# Patient Record
Sex: Male | Born: 1940 | Race: White | Hispanic: No | Marital: Married | Smoking: Former smoker
Health system: Southern US, Community
[De-identification: ages and names within clinical notes are randomized; demographics above are authoritative.]

## PROBLEM LIST (undated history)

## (undated) DIAGNOSIS — E7800 Pure hypercholesterolemia, unspecified: Secondary | ICD-10-CM

## (undated) DIAGNOSIS — K219 Gastro-esophageal reflux disease without esophagitis: Secondary | ICD-10-CM

## (undated) DIAGNOSIS — D649 Anemia, unspecified: Secondary | ICD-10-CM

## (undated) DIAGNOSIS — M1990 Unspecified osteoarthritis, unspecified site: Secondary | ICD-10-CM

## (undated) DIAGNOSIS — N400 Enlarged prostate without lower urinary tract symptoms: Secondary | ICD-10-CM

## (undated) DIAGNOSIS — IMO0001 Reserved for inherently not codable concepts without codable children: Secondary | ICD-10-CM

## (undated) DIAGNOSIS — T7840XA Allergy, unspecified, initial encounter: Secondary | ICD-10-CM

## (undated) HISTORY — PX: COLONOSCOPY: SHX174

## (undated) HISTORY — DX: Benign prostatic hyperplasia without lower urinary tract symptoms: N40.0

## (undated) HISTORY — DX: Gastro-esophageal reflux disease without esophagitis: K21.9

## (undated) HISTORY — DX: Reserved for inherently not codable concepts without codable children: IMO0001

## (undated) HISTORY — PX: HEMORRHOID SURGERY: SHX153

## (undated) HISTORY — DX: Unspecified osteoarthritis, unspecified site: M19.90

## (undated) HISTORY — DX: Pure hypercholesterolemia, unspecified: E78.00

## (undated) HISTORY — DX: Allergy, unspecified, initial encounter: T78.40XA

## (undated) HISTORY — DX: Anemia, unspecified: D64.9

## (undated) HISTORY — PX: ROTATOR CUFF REPAIR: SHX139

## (undated) HISTORY — PX: SHOULDER SURGERY: SHX246

---

## 2012-10-27 ENCOUNTER — Other Ambulatory Visit: Payer: Self-pay | Admitting: Orthopaedic Surgery

## 2012-10-27 DIAGNOSIS — M545 Low back pain, unspecified: Secondary | ICD-10-CM

## 2012-11-05 ENCOUNTER — Ambulatory Visit
Admission: RE | Admit: 2012-11-05 | Discharge: 2012-11-05 | Disposition: A | Discharge status: Home / Self Care | Payer: Medicare Other | Source: Ambulatory Visit | Attending: Orthopaedic Surgery | Admitting: Orthopaedic Surgery

## 2012-11-05 DIAGNOSIS — M545 Low back pain, unspecified: Secondary | ICD-10-CM

## 2013-02-09 DIAGNOSIS — L03039 Cellulitis of unspecified toe: Secondary | ICD-10-CM | POA: Diagnosis not present

## 2013-02-21 DIAGNOSIS — M47817 Spondylosis without myelopathy or radiculopathy, lumbosacral region: Secondary | ICD-10-CM | POA: Diagnosis not present

## 2013-02-24 DIAGNOSIS — L821 Other seborrheic keratosis: Secondary | ICD-10-CM | POA: Diagnosis not present

## 2013-02-24 DIAGNOSIS — L219 Seborrheic dermatitis, unspecified: Secondary | ICD-10-CM | POA: Diagnosis not present

## 2013-03-09 DIAGNOSIS — K429 Umbilical hernia without obstruction or gangrene: Secondary | ICD-10-CM | POA: Diagnosis not present

## 2013-03-13 DIAGNOSIS — M545 Low back pain, unspecified: Secondary | ICD-10-CM | POA: Diagnosis not present

## 2013-03-13 DIAGNOSIS — M47817 Spondylosis without myelopathy or radiculopathy, lumbosacral region: Secondary | ICD-10-CM | POA: Diagnosis not present

## 2013-03-17 DIAGNOSIS — Z79899 Other long term (current) drug therapy: Secondary | ICD-10-CM | POA: Diagnosis not present

## 2013-03-17 DIAGNOSIS — N529 Male erectile dysfunction, unspecified: Secondary | ICD-10-CM | POA: Diagnosis not present

## 2013-03-17 DIAGNOSIS — D126 Benign neoplasm of colon, unspecified: Secondary | ICD-10-CM | POA: Diagnosis not present

## 2013-03-17 DIAGNOSIS — K449 Diaphragmatic hernia without obstruction or gangrene: Secondary | ICD-10-CM | POA: Diagnosis not present

## 2013-03-17 DIAGNOSIS — K5730 Diverticulosis of large intestine without perforation or abscess without bleeding: Secondary | ICD-10-CM | POA: Diagnosis not present

## 2013-03-17 DIAGNOSIS — Z87891 Personal history of nicotine dependence: Secondary | ICD-10-CM | POA: Diagnosis not present

## 2013-03-17 DIAGNOSIS — D509 Iron deficiency anemia, unspecified: Secondary | ICD-10-CM | POA: Diagnosis not present

## 2013-03-17 DIAGNOSIS — Z86010 Personal history of colonic polyps: Secondary | ICD-10-CM | POA: Diagnosis not present

## 2013-03-17 DIAGNOSIS — N400 Enlarged prostate without lower urinary tract symptoms: Secondary | ICD-10-CM | POA: Diagnosis not present

## 2013-03-17 DIAGNOSIS — Z7982 Long term (current) use of aspirin: Secondary | ICD-10-CM | POA: Diagnosis not present

## 2013-03-17 DIAGNOSIS — K219 Gastro-esophageal reflux disease without esophagitis: Secondary | ICD-10-CM | POA: Diagnosis not present

## 2013-03-17 DIAGNOSIS — R12 Heartburn: Secondary | ICD-10-CM | POA: Diagnosis not present

## 2013-03-17 DIAGNOSIS — E785 Hyperlipidemia, unspecified: Secondary | ICD-10-CM | POA: Diagnosis not present

## 2013-04-19 DIAGNOSIS — M545 Low back pain, unspecified: Secondary | ICD-10-CM | POA: Diagnosis not present

## 2013-04-19 DIAGNOSIS — M47817 Spondylosis without myelopathy or radiculopathy, lumbosacral region: Secondary | ICD-10-CM | POA: Diagnosis not present

## 2013-05-01 DIAGNOSIS — N320 Bladder-neck obstruction: Secondary | ICD-10-CM | POA: Diagnosis not present

## 2013-05-01 DIAGNOSIS — R972 Elevated prostate specific antigen [PSA]: Secondary | ICD-10-CM | POA: Diagnosis not present

## 2013-05-01 DIAGNOSIS — N529 Male erectile dysfunction, unspecified: Secondary | ICD-10-CM | POA: Diagnosis not present

## 2013-05-01 DIAGNOSIS — N400 Enlarged prostate without lower urinary tract symptoms: Secondary | ICD-10-CM | POA: Diagnosis not present

## 2013-05-29 DIAGNOSIS — R972 Elevated prostate specific antigen [PSA]: Secondary | ICD-10-CM | POA: Diagnosis not present

## 2013-06-16 DIAGNOSIS — M47817 Spondylosis without myelopathy or radiculopathy, lumbosacral region: Secondary | ICD-10-CM | POA: Diagnosis not present

## 2013-06-16 DIAGNOSIS — IMO0002 Reserved for concepts with insufficient information to code with codable children: Secondary | ICD-10-CM | POA: Diagnosis not present

## 2013-06-16 DIAGNOSIS — M545 Low back pain, unspecified: Secondary | ICD-10-CM | POA: Diagnosis not present

## 2013-06-19 DIAGNOSIS — M545 Low back pain, unspecified: Secondary | ICD-10-CM | POA: Diagnosis not present

## 2013-06-19 DIAGNOSIS — M48061 Spinal stenosis, lumbar region without neurogenic claudication: Secondary | ICD-10-CM | POA: Diagnosis not present

## 2013-06-19 DIAGNOSIS — G243 Spasmodic torticollis: Secondary | ICD-10-CM | POA: Diagnosis not present

## 2013-06-19 DIAGNOSIS — D509 Iron deficiency anemia, unspecified: Secondary | ICD-10-CM | POA: Diagnosis not present

## 2013-06-19 DIAGNOSIS — Z23 Encounter for immunization: Secondary | ICD-10-CM | POA: Diagnosis not present

## 2013-06-27 DIAGNOSIS — M47817 Spondylosis without myelopathy or radiculopathy, lumbosacral region: Secondary | ICD-10-CM | POA: Diagnosis not present

## 2013-06-27 DIAGNOSIS — M545 Low back pain, unspecified: Secondary | ICD-10-CM | POA: Diagnosis not present

## 2013-09-14 ENCOUNTER — Ambulatory Visit: Payer: Medicare Other | Admitting: Podiatry

## 2013-09-14 ENCOUNTER — Other Ambulatory Visit: Payer: Self-pay | Admitting: *Deleted

## 2013-09-14 ENCOUNTER — Encounter: Payer: Self-pay | Admitting: Podiatry

## 2013-09-14 VITALS — BP 143/85 | HR 70 | Resp 16 | Ht 68.0 in | Wt 165.0 lb

## 2013-09-14 DIAGNOSIS — L600 Ingrowing nail: Secondary | ICD-10-CM | POA: Diagnosis not present

## 2013-09-14 MED ORDER — NEOMYCIN-POLYMYXIN-HC 3.5-10000-1 OT SOLN: OTIC | Status: DC

## 2013-09-14 NOTE — Progress Notes (Signed)
   Subjective:    Patient ID: Hutchinson Isenberg, male    DOB: Oct 03, 1940, 73 y.o.   MRN: [id number]  HPI Comments: Problem with an ingrown toenail on the right great toe, lateral corner. It has been sore for a while and if i hit it i can feel it  It was worked on in January but it seems to have come back      Review of Systems  Genitourinary: Positive for frequency.  Skin:       Change in nails   All other systems reviewed and are negative.      Objective:   Physical Exam: I have reviewed his past medical history medications allergies surgeries social history and review of systems. Pulses are strongly palpable bilateral. Neurologic sensorium is intact per Semmes-Weinstein monofilament. Deep tendon reflexes are intact bilateral muscle strength is 5 over 5 dorsiflexors plantar flexors inverters everters all intrinsic musculature is intact. Orthopedic evaluation demonstrates all joints distal to the ankle a full range of motion without crepitation. Cutaneous evaluation demonstrates supple well hydrated cutis with exception of sharp incurvated nail margin along the fibular border of the hallux right. This is painful on palpation there is erythema there is no. Once her drainage.          Assessment & Plan:  Assessment: Pain in limb secondary to ingrown nail paronychia abscess hallux right.  Plan: Discussed etiology pathology cervical versus surgical therapies. At this point we performed a chemical matrixectomy after local anesthetic been administered. He tolerated procedure well. He was given both oral and written home-going instructions for the care of this toe as well as a prescription for Cortisporin Otic which will be sent to his pharmacy for use after soaking twice daily in Betadine and water. We will followup with him in one to 2 weeks to make sure this is healing well. Should there be questions or concerns she will notify us immediately.

## 2013-09-14 NOTE — Patient Instructions (Signed)

## 2013-09-15 ENCOUNTER — Telehealth: Payer: Self-pay | Admitting: *Deleted

## 2013-09-15 NOTE — Telephone Encounter (Signed)
Saw yesterday for ingrown toenail.  I'm soaking like he said.  How many days am I to do this?

## 2013-09-15 NOTE — Telephone Encounter (Signed)
I saw him yesterday.  I need to know how long I need to continue the soaks. I told him as long as he has drainage, he needs to continue the soaks.    He asked how long does the drainage last.  I told him it varies from person to person can't give a definite time.  He stated okay thank you.

## 2013-09-19 ENCOUNTER — Ambulatory Visit (INDEPENDENT_AMBULATORY_CARE_PROVIDER_SITE_OTHER): Payer: Medicare Other | Admitting: Podiatry

## 2013-09-19 ENCOUNTER — Encounter: Payer: Self-pay | Admitting: Podiatry

## 2013-09-19 DIAGNOSIS — L600 Ingrowing nail: Secondary | ICD-10-CM

## 2013-09-19 NOTE — Progress Notes (Signed)
He presents today for followup of his matrixectomy tibial border hallux right. He continues to soak twice daily in Betadine and water and apply Cortisporin otic as directed he denies fever chills nausea vomiting muscle aches and pains.  Objective: Vital signs are stable he is alert and oriented x3. There is no erythema edema cellulitis drainage or odor.  Assessment: Well-healing matrixectomy tibial border hallux right.  Plan: Discontinue Betadine start with Epsom salts warm water soaks continue to soak twice daily until completely healed. Continue Cortisporin Otic as directed. Covered in the day and leave open at night.

## 2013-09-21 DIAGNOSIS — D649 Anemia, unspecified: Secondary | ICD-10-CM | POA: Diagnosis not present

## 2013-09-25 DIAGNOSIS — M545 Low back pain, unspecified: Secondary | ICD-10-CM | POA: Diagnosis not present

## 2013-09-25 DIAGNOSIS — M47817 Spondylosis without myelopathy or radiculopathy, lumbosacral region: Secondary | ICD-10-CM | POA: Diagnosis not present

## 2013-09-26 ENCOUNTER — Ambulatory Visit: Payer: Medicare Other | Admitting: Podiatry

## 2013-09-27 ENCOUNTER — Telehealth: Payer: Self-pay | Admitting: *Deleted

## 2013-09-27 NOTE — Telephone Encounter (Signed)
I need to ask or get some information about how much longer to continue this treatment, soaking and so forth.  Someone give me a call, I'd appreciate it.  I called and advised him to continue to do the soaks and dressings as long as there is drainage on the bandaid.  He stated there is just a little spot, it may be just from the drops but I'm not sure.  What should I do?  I recommended to him to continue the soaks.  He stated well I've been soaking for about a week, is this typical?  I told him yes, that is normal.  He said well thank you for calling, I may call you again.  I told him okay.

## 2013-10-03 ENCOUNTER — Ambulatory Visit (INDEPENDENT_AMBULATORY_CARE_PROVIDER_SITE_OTHER): Payer: Medicare Other | Admitting: Podiatry

## 2013-10-03 ENCOUNTER — Encounter: Payer: Self-pay | Admitting: Podiatry

## 2013-10-03 VITALS — BP 122/76 | HR 72 | Resp 16

## 2013-10-03 DIAGNOSIS — L600 Ingrowing nail: Secondary | ICD-10-CM | POA: Diagnosis not present

## 2013-10-03 NOTE — Progress Notes (Signed)
   Subjective:    Patient ID: Juan Kennedy, male    DOB: 1940/02/06, 74 y.o.   MRN: [id number]  HPI Comments:  "Still some tenderness in there"  Follow up Matrixectomy 1st toe right - medial border   States that he has been continuing with Epson salts daily followed by antibiotic ointment and a Band-Aid. He was using the Corticosporin drops but states it is difficult to keep medication at the procedure site. Denies any systemic complaints such as fevers, chills, nausea, vomiting. No other complaints at this time.    Review of Systems  All other systems reviewed and are negative.      Objective:   Physical Exam AAO x3, NAD DP/PT pulses palpable b/l. CRT < 3sec Protective sensation intact with the Semmes Weinstein monofilament Right hallux medial border status post partial nail avulsion. There is no surrounding erythema, edema, drainage, purulence. The site has not completely healed at this time although close.        Assessment & Plan:  The 73 year old male status post right tibial border matrixectomy -At this time the wound appears to be healing nicely without any clinical signs of infection. Continue with Epsom salts soaks until the wound is completely healed. During the day dressing interbody ointment and a Band-Aid and can leave uncovered at night. -Monitor for any clinical signs or symptoms of infection and directed to go to directed to the emergency room if any are to occur call the office immediately. -Followup in 2 weeks or sooner if any palms are to arise. Call with any questions or concerns or change in symptoms.

## 2013-10-03 NOTE — Patient Instructions (Signed)
Monitor for any signs/symptoms of infection. Call the office immediately if any occur or go directly to the emergency room. Call with any questions/concerns.  

## 2013-10-13 ENCOUNTER — Ambulatory Visit: Payer: Medicare Other | Admitting: Podiatry

## 2013-10-23 ENCOUNTER — Telehealth: Payer: Self-pay | Admitting: *Deleted

## 2013-10-23 DIAGNOSIS — N529 Male erectile dysfunction, unspecified: Secondary | ICD-10-CM | POA: Diagnosis not present

## 2013-10-23 DIAGNOSIS — R972 Elevated prostate specific antigen [PSA]: Secondary | ICD-10-CM | POA: Diagnosis not present

## 2013-10-23 DIAGNOSIS — N138 Other obstructive and reflux uropathy: Secondary | ICD-10-CM | POA: Diagnosis not present

## 2013-10-23 NOTE — Telephone Encounter (Signed)
I've been seen for an ingrown toenail a couple times, rechecks and everything.  It's still continuing to bother me.  I'd like somebody to take a look at it if they could.  I last saw Dr. Jacqualyn Posey September 1 and then we went on vacation.  I just don't think things are going on like they should.  Give me a call, thank you.  Patient walked in to the office.  He stated that there is a brown spot that has developed underneath the nail and it is sore to put pressure on it.  I asked if it was a scab.  He stated it was not.  I told him we need for him to schedule an appointment with Dr. Jacqualyn Posey to determine if it may be fungus or something else.  He scheduled an appointment.

## 2013-10-25 ENCOUNTER — Encounter: Payer: Self-pay | Admitting: Podiatry

## 2013-10-25 ENCOUNTER — Ambulatory Visit (INDEPENDENT_AMBULATORY_CARE_PROVIDER_SITE_OTHER): Payer: Medicare Other | Admitting: Podiatry

## 2013-10-25 VITALS — BP 143/89 | HR 80 | Resp 16

## 2013-10-25 DIAGNOSIS — L600 Ingrowing nail: Secondary | ICD-10-CM

## 2013-10-25 MED ORDER — CEPHALEXIN 500 MG PO CAPS: 500.0000 mg | ORAL_CAPSULE | Freq: Three times a day (TID) | ORAL | Status: DC

## 2013-10-25 NOTE — Patient Instructions (Signed)
Continue to soak in Epson salts twice a day cover with antibiotic ointment and a Band-Aid. Monitor for any signs/symptoms of infection. Call the office immediately if any occur or go directly to the emergency room. Call with any questions/concerns.

## 2013-10-26 ENCOUNTER — Encounter: Payer: Self-pay | Admitting: Podiatry

## 2013-10-26 NOTE — Progress Notes (Signed)
Patient ID: Juan Kennedy, male   DOB: [birth year], 73 y.o.   MRN: [id number]  Subjective: Juan Kennedy returns the office they for followup evaluation of matricectomy right medial border. States that he was doing well but since has noticed a dark spot appearing under the nail just to the side where the procedure was done. Also states that he is started to have mild amount of discomfort around the nail base with a procedure was performed Denies any drainage from around the site. Has noticed a mild amount of redness at the site of discomfort. Denies any systemic complaints as fevers, chills, nausea, vomiting. No other complaints at this time.  Objective: AAO x3, NAD DP PT pulses palpable bilaterally, CRT< 3 seconds. Neurological status unchanged. Right medial hallux border status post partial nail avulsion. There is slight localized erythema at the proximal medial nail border with mild discomfort. There is no drainage, malodor, purulence. There is no ascending cellulitis. Just under the nail next the site of the nail avulsion in the proximal nail is a slight brown discolored spot.  MMT 5/5, ROM WNL No leg pain/swelling/warmth  Assessment: 73 year old male status post right tibial border matrixectomy with mild recurrence of pain at the site of the nail procedure.  Plan: -Conservative versus surgical treatment were discussed including alternatives, risks, complications. -The proximal medial nail border was debrided with a curette to remove a large amount of debris from the site of the nail avulsion. This area was localized to that small mild area of erythema. There is no purulence or drainage noted. Discussed we could proceed with another partial nail avulsion but hopefully debridement of the area will relief symptoms. Also patient will be leaving for vacation for approximately one week to [place]. Discussed that if symptoms continue can proceed with a repeat procedure if needed. Do to the mild  erythema, prescribed keflex.  -Followup once he returns from vacation or sooner if needed. In the meantime call any questions, concerns, change in symptoms. Monitor for any clinical signs or symptoms of infection and directed to call the office immediately if any are to occur or go directly to the emergency room.

## 2013-11-08 ENCOUNTER — Encounter: Payer: Self-pay | Admitting: Podiatry

## 2013-11-08 ENCOUNTER — Ambulatory Visit (INDEPENDENT_AMBULATORY_CARE_PROVIDER_SITE_OTHER): Payer: Medicare Other

## 2013-11-08 ENCOUNTER — Ambulatory Visit (INDEPENDENT_AMBULATORY_CARE_PROVIDER_SITE_OTHER): Payer: Medicare Other | Admitting: Podiatry

## 2013-11-08 VITALS — BP 165/79 | HR 74 | Resp 14

## 2013-11-08 DIAGNOSIS — M79674 Pain in right toe(s): Secondary | ICD-10-CM | POA: Diagnosis not present

## 2013-11-08 DIAGNOSIS — L600 Ingrowing nail: Secondary | ICD-10-CM

## 2013-11-08 NOTE — Patient Instructions (Addendum)
Soak Instructions    THE DAY AFTER THE PROCEDURE  Place 1/4 cup of epsom salts in a quart of warm tap water.  Submerge your foot or feet with outer bandage intact for the initial soak; this will allow the bandage to become moist and wet for easy lift off.  Once you remove your bandage, continue to soak in the solution for 20 minutes.  This soak should be done twice a day.  Next, remove your foot or feet from solution, blot dry the affected area and cover.  You may use a band aid large enough to cover the area or use gauze and tape.  Apply other medications to the area as directed by the doctor such as polysporin neosporin.  IF YOUR SKIN BECOMES IRRITATED WHILE USING THESE INSTRUCTIONS, IT IS OKAY TO SWITCH TO  WHITE VINEGAR AND WATER. Or you may use antibacterial soap and water to keep the toe clean  Monitor for any signs/symptoms of infection. Call the office immediately if any occur or go directly to the emergency room. Call with any questions/concerns.  Continue keflex.

## 2013-11-08 NOTE — Progress Notes (Signed)
Patient ID: Juan Kennedy, male   DOB: 09-Nov-1940, 73 y.o.   MRN: [id number]  Subjective: Juan Kennedy, 73 year old male, returns the office they for followup evaluation for right medial hallux ingrown toenail. He states his last appointment has had increased pain along the the nail border and points to the proximal medial nail border. He has had localized erythema and edema over the area. Denies any drainage. Denies any systemic complaints as fevers, chills, nausea, vomiting. No other complaints at this time. No acute changes since last appointment.  Objective: AAO x3, NAD DP/PT pulses palpable 2/4 b/l. CRT < 3 sec Protective sensation intact with Simms Weinstein monofilament, vibratory sensation intact, Achilles tendon reflex intact. Right medial border of the hallux nail with localized erythema and edema. There is mild tenderness to palpation over the site. No drainage. No ascending cellulitis. No fluctuance, crepitus. No calf pain, swelling, warmth. MMT 5/5, ROM WNL  Assessment: 73 year old male with recurrent right medial hallux ingrown toenail.  Plan: -X-rays were obtained and reviewed the patient. There is a small exostosis off the dorsal aspect of the distal phalanx. Spur off the medial aspect of the phalanx. -Conservative versus surgical treatment were discussed including alternatives, risks, complications. -At this time the patient is requesting further partial nail avulsion due to reoccurring ingrown toenail, erythema and edema. Risks and complications were discussed the patient for which she understands and wishes to proceed. Under sterile skin preparation total of 2.5 cc of a one-to-one mixture of 2% lidocaine plain and 0.5% Marcaine plain was infiltrated in a hallux block fashion the right foot. Once anesthetized the right hallux was prepped in sterile fashion. Next a small portion of the medial aspect of the nail was excised and there is noted a small amount of ingrowing toenail  underneath the proximal nail border of the site of the localized erythema and edema. No purulence was identified. Once removed the area was inspected no further nail was identified. Underlying skin intact. The site was then irrigated and betadine ointment was applied followed by a dry sterile dressing. A tourniquet was used for the procedure and after application a dressing was removed and there is noted to be an immediate capillary refill time noted to the digit. Patient tolerated procedure well without complications. -Post procedure instructions discussed the patient in detail for which he verbally understood. -Start Keflex for 1 week. -Followup in 1 week or sooner if any problems are to arise or any change in symptoms. In the meantime call the office with any questions, concerns. Monitor for any signs or symptoms of infection and directed to call the office immediately if any are to occur or go directly to the emergency room.

## 2013-11-09 DIAGNOSIS — H2513 Age-related nuclear cataract, bilateral: Secondary | ICD-10-CM | POA: Diagnosis not present

## 2013-11-15 ENCOUNTER — Encounter: Payer: Self-pay | Admitting: Podiatry

## 2013-11-15 ENCOUNTER — Ambulatory Visit (INDEPENDENT_AMBULATORY_CARE_PROVIDER_SITE_OTHER): Payer: Medicare Other | Admitting: Podiatry

## 2013-11-15 VITALS — BP 158/88 | HR 70 | Resp 14

## 2013-11-15 DIAGNOSIS — Z9889 Other specified postprocedural states: Secondary | ICD-10-CM

## 2013-11-15 NOTE — Progress Notes (Signed)
Patient ID: Juan Kennedy, male   DOB: 1940-06-05, 72 y.o.   MRN: [id number]  Subjective: Juan Kennedy returns the office today for followup of evaluaiton status post right medial hallux partial nail avulsiions for ingrown toenail recurrence. He states that his pain is decreased since last appointment. He does have a small area of the brown spot underneath the nail next to where the procedure was performed for which he is concerned about. Denies any increase redness, ascending cellulitis. Mild amount of bloody drainage. He has been doing twice a day epsom salt soaks and taking keflex. No other complaints at this time. Denies any systemic complaints as fevers, chills, nausea, vomiting.  Objective: AA0 x3, NAD DP/PT pulses palpable bilaterally, CRT less than 3 seconds Protective sensations Weinstein monofilament Status post medial border right hallux partial nail avulsion. Area appears to be healing appropriately at this timeframe. There is a small amount of subungual hematoma on the middle portion of the nail just lateral to the procedure site. No surrounding erythema or ascending cellulitis. No drainage/purulence identified. No tenderness to palpation over the procedure site. No malodor. No fluctuance/crepitance.  No calf pain, swelling, warmth.  Assessment: 73 year old male status post right medial border of the hallux partial nail avulsion due to recurrent ingrown nail.  Plan: -Treatment options discussed including alternatives, risks, complications. -Continue with twice a day Epson salt soaks followed by antibiotic ointment and a Band-Aid. Can leave uncovered at night. -Can finished his course of Keflex for which he finishes today and no further need for antibiotics. -Monitor for any signs or symptoms of infection and directed to call the office immediately if any are to occur or go directly to the emergency room. -Discussed the patient at the brown spots of most likely from subungual  hematoma from the procedure and will likely grow out with the nail. -Followup in 2 weeks or sooner if any problems are to arise or any changes symptoms. Call the office with any questions, concerns.

## 2013-11-15 NOTE — Patient Instructions (Signed)
Continue soaking in epsom salts twice a day and cover with antibiotic ointment and band-aid. Can leave uncovered.  Monitor for any signs/symptoms of infection. Call the office immediately if any occur or go directly to the emergency room. Call with any questions/concerns.

## 2013-11-16 DIAGNOSIS — Z23 Encounter for immunization: Secondary | ICD-10-CM | POA: Diagnosis not present

## 2013-11-29 ENCOUNTER — Ambulatory Visit (INDEPENDENT_AMBULATORY_CARE_PROVIDER_SITE_OTHER): Payer: Medicare Other | Admitting: Podiatry

## 2013-11-29 ENCOUNTER — Ambulatory Visit: Payer: Medicare Other | Admitting: Podiatry

## 2013-11-29 ENCOUNTER — Encounter: Payer: Self-pay | Admitting: Podiatry

## 2013-11-29 VITALS — BP 134/78 | HR 71 | Resp 14

## 2013-11-29 DIAGNOSIS — L600 Ingrowing nail: Secondary | ICD-10-CM

## 2013-11-29 DIAGNOSIS — M79674 Pain in right toe(s): Secondary | ICD-10-CM

## 2013-11-30 ENCOUNTER — Encounter: Payer: Self-pay | Admitting: Podiatry

## 2013-11-30 NOTE — Progress Notes (Signed)
Patient ID: Juan Kennedy, male   DOB: Oct 21, 1940, 73 y.o.   MRN: [id number]  Subjective: Patient returns to the office for follow up evaluation status post right medial hallux partial nail avulsion. He states that it is more "normal feeling that it has in awhile". He continues to soak twice a day followed by applying interbody appointment and a Band-Aid in the day and leave it uncovered at night. He denies any drainage or pain associated with the area at this time. Denies any systemic complaints of fevers, chills, nausea, vomiting. No other complaints at this time. No acute changes and facet appointment.  Objective: AAO x3, NAD DP/PT pulses palpable bilaterally, CRT less than 3 seconds Protective sensation intact with Simms Weinstein monofilament Since his right medial border of the hallux partial nail avulsion healing well this time. There is no drainage, ascending cellulitis, pain on palpation. There is no areas of fluctuance. There is no signs of ingrowing of the nail. Small amount of hyperkeratotic tissue overlying the procedure site. No calf pain, swelling, warmth, erythema  Assessment: Right medial border of the hallux status post partial nail avulsion due to recurrent ingrown toenail, healing well  Plan: -Treatment options discussed including alternatives, risks, complications. -Continue to cover with Band-Aid during the day. Patient can continue soaking in epsom salts daily. -Monitor for any clinical signs or symptoms of infection or reoccurrence and directed to call the office immediately if any are to occur or go directly to the ER.  -Follow up in one months or sooner if any problems are to arise or any change in symptoms. In the meantime call the office with any questions, concerns.

## 2013-12-13 DIAGNOSIS — G243 Spasmodic torticollis: Secondary | ICD-10-CM | POA: Diagnosis not present

## 2013-12-27 DIAGNOSIS — D509 Iron deficiency anemia, unspecified: Secondary | ICD-10-CM | POA: Diagnosis not present

## 2013-12-27 DIAGNOSIS — L219 Seborrheic dermatitis, unspecified: Secondary | ICD-10-CM | POA: Diagnosis not present

## 2013-12-27 DIAGNOSIS — M436 Torticollis: Secondary | ICD-10-CM | POA: Diagnosis not present

## 2013-12-27 DIAGNOSIS — N400 Enlarged prostate without lower urinary tract symptoms: Secondary | ICD-10-CM | POA: Diagnosis not present

## 2013-12-27 DIAGNOSIS — R972 Elevated prostate specific antigen [PSA]: Secondary | ICD-10-CM | POA: Diagnosis not present

## 2013-12-27 DIAGNOSIS — E785 Hyperlipidemia, unspecified: Secondary | ICD-10-CM | POA: Diagnosis not present

## 2013-12-27 DIAGNOSIS — G4700 Insomnia, unspecified: Secondary | ICD-10-CM | POA: Diagnosis not present

## 2013-12-27 DIAGNOSIS — Z008 Encounter for other general examination: Secondary | ICD-10-CM | POA: Diagnosis not present

## 2013-12-27 DIAGNOSIS — Z1389 Encounter for screening for other disorder: Secondary | ICD-10-CM | POA: Diagnosis not present

## 2013-12-27 DIAGNOSIS — M5126 Other intervertebral disc displacement, lumbar region: Secondary | ICD-10-CM | POA: Diagnosis not present

## 2014-01-05 ENCOUNTER — Ambulatory Visit: Payer: Medicare Other | Admitting: Podiatry

## 2014-01-17 ENCOUNTER — Ambulatory Visit (INDEPENDENT_AMBULATORY_CARE_PROVIDER_SITE_OTHER): Payer: Medicare Other | Admitting: Podiatry

## 2014-01-17 VITALS — BP 140/85 | HR 80 | Resp 16

## 2014-01-17 DIAGNOSIS — M79674 Pain in right toe(s): Secondary | ICD-10-CM | POA: Diagnosis not present

## 2014-01-17 DIAGNOSIS — L603 Nail dystrophy: Secondary | ICD-10-CM | POA: Diagnosis not present

## 2014-01-17 NOTE — Patient Instructions (Signed)
Continue to soak in epsom salts  Monitor for any signs/symptoms of infection. Call the office immediately if any occur or go directly to the emergency room. Call with any questions/concerns.

## 2014-01-19 ENCOUNTER — Encounter: Payer: Self-pay | Admitting: Podiatry

## 2014-01-19 NOTE — Progress Notes (Signed)
Patient ID: Juan Kennedy, male   DOB: 14-Aug-1940, 73 y.o.   MRN: [id number]  Subjective: 73 year old male returns the office today for evaluation of right hallux nail swelling. He states over the last couple weeks she has noticed some swelling along the nail border for which she points to the proximal nail border. He is previously undergone 3 partial nail avulsions within the year due to a painful ingrown toenail on the medial nail border. The patient denies any increase in redness or any pain to the area. Since last appointment he has refilled the keflex and completed another course of antibiotics on his own. Denies any systemic complaints such as fevers, chills, nausea vomiting. No acute changes since last appointment, no other complaints at this time.   Objective: AAO x3, NAD DP/PT pulses palpable b/l, CRT < 3 sec Protective sensation intact with SWMF, vibratory sensation intact, Achilles tendon reflex intact.  On the medial border of the right hallux nail there is hyperkeratotic tissue within the nail border. There is mild chronic erythema around the nail however there is no signficiant increase in erythema, ascending cellulitis. There is trace edema to the proximal nail border. There is no overlying warmth or erythema over this area. There is no tenderness to palpation overlying the area or along the nail borders. The remaining nails without pathology. No areas of pinpoint bony tenderness, or pain with vibratory sensation, MMT 5/5, ROM WNL No pain with calf compression, swelling, warmth, erythema  Assessment: 73 year old male with trace edema around the proximal nail border the right hallux with hyperkeratotic tissue buildup in the procedure site.  Plan: -Treatment options both conservative and surgical were discussed with the patient including alternatives, risks, complications. -The medial aspect of the right hallux nail border was sharply debrided without complications to patient comfort  to remove hyperkeratotic tissue and debris within the nail border. The proximal nail borders also curetted to remove debris. There is no drainage or purulence identified. At this time as the patient's already had 3 partial nail avulsions of the medial nail border this year, he would likely need a total nail avulsion. He wishes to hold off on that at this time. Recommended to soak in Epson salts twice a day. If there is any increase in erythema, edema, pain, drainage, or other clinical sings/symptoms of infection to call the office immediately. -Follow-up in 2 weeks, or sooner should any problems arise. At that time, if there continues to be symptoms or if symptoms worsen will consider a total nail avulsion. In the meantime, encouraged to call with any questions/concerns/change in symptoms.

## 2014-01-31 ENCOUNTER — Encounter: Payer: Self-pay | Admitting: Podiatry

## 2014-01-31 ENCOUNTER — Ambulatory Visit (INDEPENDENT_AMBULATORY_CARE_PROVIDER_SITE_OTHER): Payer: Medicare Other | Admitting: Podiatry

## 2014-01-31 VITALS — BP 128/76 | HR 79 | Resp 18

## 2014-01-31 DIAGNOSIS — M79674 Pain in right toe(s): Secondary | ICD-10-CM | POA: Diagnosis not present

## 2014-02-02 ENCOUNTER — Encounter: Payer: Self-pay | Admitting: Podiatry

## 2014-02-02 NOTE — Progress Notes (Signed)
Patient ID: Juan Kennedy, male   DOB: [birth year], 74 y.o.   MRN: [id number]  Subjective: 74 year old male returns the office if up evaluation of right hallux swelling around the proximal nail border. Since last appointment he states he has continue soaking the feet in Epson salts twice a day followed by antibiotic ointment and a Band-Aid over the site of the prior partial nail avulsion. Since last appointment, he states the area has significantly improved and the erythema has decreased as well as the edema. He states that he does not have any pain currently to the toe. He does that if he is on his feet for prolonged time he does have some discomfort over one the nail. No other complaints at this time in no acute changes since last appointment. Denies any systemic complaints such as fevers, chills, nausea, vomiting.  Objective: AAO x3, NAD DP/PT pulses palpable bilaterally, CRT less than 3 seconds Protective sensation intact with Simms Weinstein monofilament, vibratory sensation intact, Achilles tendon reflex intact Status post partial nail avulsion right medial hallux nail border. Currently, there is no edema along the proximal/medial/lateral nail borders of the right hallux. There is trace erythema around the nail however compared to the other nails is not significant. There is no drainage or purulence expressed. No ascending cellulitis. There is no tears palpation around the nail this time. Open lesions or pre-ulcerative lesions No pain with calf compression, swelling, warmth, erythema. No areas of pinpoint bony tenderness or pain with vibratory sensation.  Assessment: 74 year old male with resolving symptoms right hallux nail.  Plan: -Treatment options were discussed the patient including alternatives, risks, complications. -At last appointment discussed the patient at the symptoms had not resolved will likely need a total nail avulsion as he has had 3 partial nail avulsions to the nail this  year. However, this time the patient symptoms have significantly improved compared to last appointment. At this time, I do not feel that a repeat procedure or total nail avulsion as needed. Patient agrees. Discussed with the patient to continue to monitor the area for any changes. In the future if there is any recurrence of symptoms or any change in symptoms he may need to have a further procedure however this time we will hold off. -Follow-up as needed. In the meantime, call the office with any question, concerns, change in symptoms.

## 2014-03-14 DIAGNOSIS — D509 Iron deficiency anemia, unspecified: Secondary | ICD-10-CM | POA: Diagnosis not present

## 2014-03-23 DIAGNOSIS — Z1212 Encounter for screening for malignant neoplasm of rectum: Secondary | ICD-10-CM | POA: Diagnosis not present

## 2014-04-18 DIAGNOSIS — K027 Dental root caries: Secondary | ICD-10-CM | POA: Diagnosis not present

## 2014-04-23 DIAGNOSIS — N401 Enlarged prostate with lower urinary tract symptoms: Secondary | ICD-10-CM | POA: Diagnosis not present

## 2014-04-23 DIAGNOSIS — R972 Elevated prostate specific antigen [PSA]: Secondary | ICD-10-CM | POA: Diagnosis not present

## 2014-04-23 DIAGNOSIS — N528 Other male erectile dysfunction: Secondary | ICD-10-CM | POA: Diagnosis not present

## 2014-04-25 DIAGNOSIS — M4726 Other spondylosis with radiculopathy, lumbar region: Secondary | ICD-10-CM | POA: Diagnosis not present

## 2014-05-07 DIAGNOSIS — K0263 Dental caries on smooth surface penetrating into pulp: Secondary | ICD-10-CM | POA: Diagnosis not present

## 2014-06-21 DIAGNOSIS — M4806 Spinal stenosis, lumbar region: Secondary | ICD-10-CM | POA: Diagnosis not present

## 2014-06-21 DIAGNOSIS — M47816 Spondylosis without myelopathy or radiculopathy, lumbar region: Secondary | ICD-10-CM | POA: Diagnosis not present

## 2014-06-21 DIAGNOSIS — M545 Low back pain: Secondary | ICD-10-CM | POA: Diagnosis not present

## 2014-06-21 DIAGNOSIS — M5416 Radiculopathy, lumbar region: Secondary | ICD-10-CM | POA: Diagnosis not present

## 2014-06-28 DIAGNOSIS — M47816 Spondylosis without myelopathy or radiculopathy, lumbar region: Secondary | ICD-10-CM | POA: Diagnosis not present

## 2014-06-28 DIAGNOSIS — M545 Low back pain: Secondary | ICD-10-CM | POA: Diagnosis not present

## 2014-09-10 DIAGNOSIS — R682 Dry mouth, unspecified: Secondary | ICD-10-CM | POA: Diagnosis not present

## 2014-09-10 DIAGNOSIS — G243 Spasmodic torticollis: Secondary | ICD-10-CM | POA: Diagnosis not present

## 2014-10-16 DIAGNOSIS — Z5320 Procedure and treatment not carried out because of patient's decision for unspecified reasons: Secondary | ICD-10-CM | POA: Diagnosis not present

## 2014-10-29 DIAGNOSIS — N403 Nodular prostate with lower urinary tract symptoms: Secondary | ICD-10-CM | POA: Diagnosis not present

## 2014-10-29 DIAGNOSIS — N529 Male erectile dysfunction, unspecified: Secondary | ICD-10-CM | POA: Diagnosis not present

## 2014-10-29 DIAGNOSIS — R972 Elevated prostate specific antigen [PSA]: Secondary | ICD-10-CM | POA: Diagnosis not present

## 2014-11-03 DIAGNOSIS — Z23 Encounter for immunization: Secondary | ICD-10-CM | POA: Diagnosis not present

## 2014-11-06 DIAGNOSIS — M542 Cervicalgia: Secondary | ICD-10-CM | POA: Diagnosis not present

## 2014-11-06 DIAGNOSIS — G243 Spasmodic torticollis: Secondary | ICD-10-CM | POA: Diagnosis not present

## 2014-11-13 DIAGNOSIS — M542 Cervicalgia: Secondary | ICD-10-CM | POA: Diagnosis not present

## 2014-11-13 DIAGNOSIS — H04123 Dry eye syndrome of bilateral lacrimal glands: Secondary | ICD-10-CM | POA: Diagnosis not present

## 2014-11-13 DIAGNOSIS — G243 Spasmodic torticollis: Secondary | ICD-10-CM | POA: Diagnosis not present

## 2014-11-13 DIAGNOSIS — H2513 Age-related nuclear cataract, bilateral: Secondary | ICD-10-CM | POA: Diagnosis not present

## 2014-11-15 DIAGNOSIS — G243 Spasmodic torticollis: Secondary | ICD-10-CM | POA: Diagnosis not present

## 2014-11-15 DIAGNOSIS — M542 Cervicalgia: Secondary | ICD-10-CM | POA: Diagnosis not present

## 2014-11-20 DIAGNOSIS — G243 Spasmodic torticollis: Secondary | ICD-10-CM | POA: Diagnosis not present

## 2014-11-20 DIAGNOSIS — M542 Cervicalgia: Secondary | ICD-10-CM | POA: Diagnosis not present

## 2014-11-22 DIAGNOSIS — M542 Cervicalgia: Secondary | ICD-10-CM | POA: Diagnosis not present

## 2014-11-22 DIAGNOSIS — G243 Spasmodic torticollis: Secondary | ICD-10-CM | POA: Diagnosis not present

## 2014-11-27 DIAGNOSIS — G243 Spasmodic torticollis: Secondary | ICD-10-CM | POA: Diagnosis not present

## 2014-11-27 DIAGNOSIS — M542 Cervicalgia: Secondary | ICD-10-CM | POA: Diagnosis not present

## 2014-12-05 DIAGNOSIS — G243 Spasmodic torticollis: Secondary | ICD-10-CM | POA: Diagnosis not present

## 2014-12-05 DIAGNOSIS — M542 Cervicalgia: Secondary | ICD-10-CM | POA: Diagnosis not present

## 2014-12-07 DIAGNOSIS — G243 Spasmodic torticollis: Secondary | ICD-10-CM | POA: Diagnosis not present

## 2014-12-07 DIAGNOSIS — M542 Cervicalgia: Secondary | ICD-10-CM | POA: Diagnosis not present

## 2014-12-10 DIAGNOSIS — M542 Cervicalgia: Secondary | ICD-10-CM | POA: Diagnosis not present

## 2014-12-10 DIAGNOSIS — G243 Spasmodic torticollis: Secondary | ICD-10-CM | POA: Diagnosis not present

## 2014-12-12 DIAGNOSIS — G243 Spasmodic torticollis: Secondary | ICD-10-CM | POA: Diagnosis not present

## 2014-12-12 DIAGNOSIS — M542 Cervicalgia: Secondary | ICD-10-CM | POA: Diagnosis not present

## 2014-12-19 DIAGNOSIS — G243 Spasmodic torticollis: Secondary | ICD-10-CM | POA: Diagnosis not present

## 2014-12-19 DIAGNOSIS — M542 Cervicalgia: Secondary | ICD-10-CM | POA: Diagnosis not present

## 2014-12-21 DIAGNOSIS — G243 Spasmodic torticollis: Secondary | ICD-10-CM | POA: Diagnosis not present

## 2014-12-21 DIAGNOSIS — M542 Cervicalgia: Secondary | ICD-10-CM | POA: Diagnosis not present

## 2014-12-26 DIAGNOSIS — M542 Cervicalgia: Secondary | ICD-10-CM | POA: Diagnosis not present

## 2014-12-26 DIAGNOSIS — G243 Spasmodic torticollis: Secondary | ICD-10-CM | POA: Diagnosis not present

## 2014-12-31 DIAGNOSIS — G243 Spasmodic torticollis: Secondary | ICD-10-CM | POA: Diagnosis not present

## 2014-12-31 DIAGNOSIS — M542 Cervicalgia: Secondary | ICD-10-CM | POA: Diagnosis not present

## 2015-01-04 DIAGNOSIS — M542 Cervicalgia: Secondary | ICD-10-CM | POA: Diagnosis not present

## 2015-01-04 DIAGNOSIS — G243 Spasmodic torticollis: Secondary | ICD-10-CM | POA: Diagnosis not present

## 2015-01-08 DIAGNOSIS — G243 Spasmodic torticollis: Secondary | ICD-10-CM | POA: Diagnosis not present

## 2015-01-08 DIAGNOSIS — M542 Cervicalgia: Secondary | ICD-10-CM | POA: Diagnosis not present

## 2015-01-10 DIAGNOSIS — G243 Spasmodic torticollis: Secondary | ICD-10-CM | POA: Diagnosis not present

## 2015-01-10 DIAGNOSIS — M542 Cervicalgia: Secondary | ICD-10-CM | POA: Diagnosis not present

## 2015-01-15 DIAGNOSIS — M542 Cervicalgia: Secondary | ICD-10-CM | POA: Diagnosis not present

## 2015-01-15 DIAGNOSIS — G243 Spasmodic torticollis: Secondary | ICD-10-CM | POA: Diagnosis not present

## 2015-01-24 DIAGNOSIS — G243 Spasmodic torticollis: Secondary | ICD-10-CM | POA: Diagnosis not present

## 2015-01-24 DIAGNOSIS — M542 Cervicalgia: Secondary | ICD-10-CM | POA: Diagnosis not present

## 2015-01-31 DIAGNOSIS — M542 Cervicalgia: Secondary | ICD-10-CM | POA: Diagnosis not present

## 2015-01-31 DIAGNOSIS — G243 Spasmodic torticollis: Secondary | ICD-10-CM | POA: Diagnosis not present

## 2015-02-07 DIAGNOSIS — M542 Cervicalgia: Secondary | ICD-10-CM | POA: Diagnosis not present

## 2015-02-07 DIAGNOSIS — G243 Spasmodic torticollis: Secondary | ICD-10-CM | POA: Diagnosis not present

## 2015-02-13 DIAGNOSIS — G243 Spasmodic torticollis: Secondary | ICD-10-CM | POA: Diagnosis not present

## 2015-02-13 DIAGNOSIS — M542 Cervicalgia: Secondary | ICD-10-CM | POA: Diagnosis not present

## 2015-02-20 DIAGNOSIS — M542 Cervicalgia: Secondary | ICD-10-CM | POA: Diagnosis not present

## 2015-02-20 DIAGNOSIS — G243 Spasmodic torticollis: Secondary | ICD-10-CM | POA: Diagnosis not present

## 2015-02-26 DIAGNOSIS — M542 Cervicalgia: Secondary | ICD-10-CM | POA: Diagnosis not present

## 2015-02-26 DIAGNOSIS — G243 Spasmodic torticollis: Secondary | ICD-10-CM | POA: Diagnosis not present

## 2015-03-06 DIAGNOSIS — M542 Cervicalgia: Secondary | ICD-10-CM | POA: Diagnosis not present

## 2015-03-06 DIAGNOSIS — G243 Spasmodic torticollis: Secondary | ICD-10-CM | POA: Diagnosis not present

## 2015-03-13 DIAGNOSIS — G243 Spasmodic torticollis: Secondary | ICD-10-CM | POA: Diagnosis not present

## 2015-03-13 DIAGNOSIS — M542 Cervicalgia: Secondary | ICD-10-CM | POA: Diagnosis not present

## 2015-03-15 DIAGNOSIS — N401 Enlarged prostate with lower urinary tract symptoms: Secondary | ICD-10-CM | POA: Diagnosis not present

## 2015-03-15 DIAGNOSIS — N529 Male erectile dysfunction, unspecified: Secondary | ICD-10-CM | POA: Diagnosis not present

## 2015-03-15 DIAGNOSIS — R972 Elevated prostate specific antigen [PSA]: Secondary | ICD-10-CM | POA: Diagnosis not present

## 2015-03-20 DIAGNOSIS — M542 Cervicalgia: Secondary | ICD-10-CM | POA: Diagnosis not present

## 2015-03-20 DIAGNOSIS — G243 Spasmodic torticollis: Secondary | ICD-10-CM | POA: Diagnosis not present

## 2015-03-29 DIAGNOSIS — I2584 Coronary atherosclerosis due to calcified coronary lesion: Secondary | ICD-10-CM | POA: Diagnosis not present

## 2015-03-29 DIAGNOSIS — E784 Other hyperlipidemia: Secondary | ICD-10-CM | POA: Diagnosis not present

## 2015-03-29 DIAGNOSIS — D509 Iron deficiency anemia, unspecified: Secondary | ICD-10-CM | POA: Diagnosis not present

## 2015-04-02 DIAGNOSIS — Z23 Encounter for immunization: Secondary | ICD-10-CM | POA: Diagnosis not present

## 2015-04-02 DIAGNOSIS — I2584 Coronary atherosclerosis due to calcified coronary lesion: Secondary | ICD-10-CM | POA: Diagnosis not present

## 2015-04-02 DIAGNOSIS — E784 Other hyperlipidemia: Secondary | ICD-10-CM | POA: Diagnosis not present

## 2015-04-02 DIAGNOSIS — N400 Enlarged prostate without lower urinary tract symptoms: Secondary | ICD-10-CM | POA: Diagnosis not present

## 2015-04-02 DIAGNOSIS — Z6825 Body mass index (BMI) 25.0-25.9, adult: Secondary | ICD-10-CM | POA: Diagnosis not present

## 2015-04-02 DIAGNOSIS — Z87891 Personal history of nicotine dependence: Secondary | ICD-10-CM | POA: Diagnosis not present

## 2015-04-02 DIAGNOSIS — Z0000 Encounter for general adult medical examination without abnormal findings: Secondary | ICD-10-CM | POA: Diagnosis not present

## 2015-04-02 DIAGNOSIS — R972 Elevated prostate specific antigen [PSA]: Secondary | ICD-10-CM | POA: Diagnosis not present

## 2015-04-02 DIAGNOSIS — D509 Iron deficiency anemia, unspecified: Secondary | ICD-10-CM | POA: Diagnosis not present

## 2015-04-02 DIAGNOSIS — Z1389 Encounter for screening for other disorder: Secondary | ICD-10-CM | POA: Diagnosis not present

## 2015-04-03 DIAGNOSIS — M542 Cervicalgia: Secondary | ICD-10-CM | POA: Diagnosis not present

## 2015-04-03 DIAGNOSIS — G243 Spasmodic torticollis: Secondary | ICD-10-CM | POA: Diagnosis not present

## 2015-04-08 DIAGNOSIS — Z1212 Encounter for screening for malignant neoplasm of rectum: Secondary | ICD-10-CM | POA: Diagnosis not present

## 2015-05-06 DIAGNOSIS — M542 Cervicalgia: Secondary | ICD-10-CM | POA: Diagnosis not present

## 2015-05-06 DIAGNOSIS — G243 Spasmodic torticollis: Secondary | ICD-10-CM | POA: Diagnosis not present

## 2015-05-14 DIAGNOSIS — M542 Cervicalgia: Secondary | ICD-10-CM | POA: Diagnosis not present

## 2015-05-14 DIAGNOSIS — G243 Spasmodic torticollis: Secondary | ICD-10-CM | POA: Diagnosis not present

## 2015-05-22 DIAGNOSIS — G243 Spasmodic torticollis: Secondary | ICD-10-CM | POA: Diagnosis not present

## 2015-05-22 DIAGNOSIS — M542 Cervicalgia: Secondary | ICD-10-CM | POA: Diagnosis not present

## 2015-05-30 DIAGNOSIS — G243 Spasmodic torticollis: Secondary | ICD-10-CM | POA: Diagnosis not present

## 2015-05-30 DIAGNOSIS — M542 Cervicalgia: Secondary | ICD-10-CM | POA: Diagnosis not present

## 2015-06-04 DIAGNOSIS — R972 Elevated prostate specific antigen [PSA]: Secondary | ICD-10-CM | POA: Diagnosis not present

## 2015-06-06 DIAGNOSIS — M542 Cervicalgia: Secondary | ICD-10-CM | POA: Diagnosis not present

## 2015-06-06 DIAGNOSIS — G243 Spasmodic torticollis: Secondary | ICD-10-CM | POA: Diagnosis not present

## 2015-06-20 DIAGNOSIS — G243 Spasmodic torticollis: Secondary | ICD-10-CM | POA: Diagnosis not present

## 2015-06-20 DIAGNOSIS — M542 Cervicalgia: Secondary | ICD-10-CM | POA: Diagnosis not present

## 2015-06-26 DIAGNOSIS — M542 Cervicalgia: Secondary | ICD-10-CM | POA: Diagnosis not present

## 2015-06-26 DIAGNOSIS — G243 Spasmodic torticollis: Secondary | ICD-10-CM | POA: Diagnosis not present

## 2015-07-03 DIAGNOSIS — G243 Spasmodic torticollis: Secondary | ICD-10-CM | POA: Diagnosis not present

## 2015-07-03 DIAGNOSIS — M542 Cervicalgia: Secondary | ICD-10-CM | POA: Diagnosis not present

## 2015-07-10 DIAGNOSIS — M542 Cervicalgia: Secondary | ICD-10-CM | POA: Diagnosis not present

## 2015-07-10 DIAGNOSIS — G243 Spasmodic torticollis: Secondary | ICD-10-CM | POA: Diagnosis not present

## 2015-07-12 DIAGNOSIS — Z79899 Other long term (current) drug therapy: Secondary | ICD-10-CM | POA: Diagnosis not present

## 2015-07-12 DIAGNOSIS — Z87891 Personal history of nicotine dependence: Secondary | ICD-10-CM | POA: Diagnosis not present

## 2015-07-12 DIAGNOSIS — Z882 Allergy status to sulfonamides status: Secondary | ICD-10-CM | POA: Diagnosis not present

## 2015-07-12 DIAGNOSIS — G243 Spasmodic torticollis: Secondary | ICD-10-CM | POA: Diagnosis not present

## 2015-07-12 DIAGNOSIS — E785 Hyperlipidemia, unspecified: Secondary | ICD-10-CM | POA: Diagnosis not present

## 2015-07-17 DIAGNOSIS — G243 Spasmodic torticollis: Secondary | ICD-10-CM | POA: Diagnosis not present

## 2015-07-17 DIAGNOSIS — M542 Cervicalgia: Secondary | ICD-10-CM | POA: Diagnosis not present

## 2015-08-08 DIAGNOSIS — M542 Cervicalgia: Secondary | ICD-10-CM | POA: Diagnosis not present

## 2015-08-08 DIAGNOSIS — G243 Spasmodic torticollis: Secondary | ICD-10-CM | POA: Diagnosis not present

## 2015-08-19 DIAGNOSIS — G243 Spasmodic torticollis: Secondary | ICD-10-CM | POA: Diagnosis not present

## 2015-08-19 DIAGNOSIS — M542 Cervicalgia: Secondary | ICD-10-CM | POA: Diagnosis not present

## 2015-08-22 DIAGNOSIS — M545 Low back pain: Secondary | ICD-10-CM | POA: Diagnosis not present

## 2015-08-22 DIAGNOSIS — M47816 Spondylosis without myelopathy or radiculopathy, lumbar region: Secondary | ICD-10-CM | POA: Diagnosis not present

## 2015-09-13 DIAGNOSIS — N401 Enlarged prostate with lower urinary tract symptoms: Secondary | ICD-10-CM | POA: Diagnosis not present

## 2015-09-13 DIAGNOSIS — R972 Elevated prostate specific antigen [PSA]: Secondary | ICD-10-CM | POA: Diagnosis not present

## 2015-09-13 DIAGNOSIS — N528 Other male erectile dysfunction: Secondary | ICD-10-CM | POA: Diagnosis not present

## 2015-11-14 DIAGNOSIS — H2513 Age-related nuclear cataract, bilateral: Secondary | ICD-10-CM | POA: Diagnosis not present

## 2015-11-14 DIAGNOSIS — H52223 Regular astigmatism, bilateral: Secondary | ICD-10-CM | POA: Diagnosis not present

## 2015-11-14 DIAGNOSIS — H5203 Hypermetropia, bilateral: Secondary | ICD-10-CM | POA: Diagnosis not present

## 2015-11-14 DIAGNOSIS — H04123 Dry eye syndrome of bilateral lacrimal glands: Secondary | ICD-10-CM | POA: Diagnosis not present

## 2015-11-14 DIAGNOSIS — H524 Presbyopia: Secondary | ICD-10-CM | POA: Diagnosis not present

## 2016-01-21 ENCOUNTER — Telehealth (INDEPENDENT_AMBULATORY_CARE_PROVIDER_SITE_OTHER): Payer: Self-pay | Admitting: Physical Medicine and Rehabilitation

## 2016-01-21 DIAGNOSIS — M545 Low back pain, unspecified: Secondary | ICD-10-CM

## 2016-01-21 NOTE — Telephone Encounter (Signed)
He should ICE, use cushion etc. If he is taking NSAID he should take that full strength for a week. We could do a small amount of prednisone orally. Would jump to injection that quick. Have seen him in past for facet injections

## 2016-01-21 NOTE — Telephone Encounter (Signed)
I called patient to discuss. No answer and no option to leave a message.

## 2016-01-22 MED ORDER — PREDNISONE 50 MG PO TABS: ORAL_TABLET | ORAL | 0 refills | Status: DC

## 2016-01-22 NOTE — Telephone Encounter (Signed)
done

## 2016-01-30 ENCOUNTER — Ambulatory Visit (INDEPENDENT_AMBULATORY_CARE_PROVIDER_SITE_OTHER): Payer: Commercial Managed Care - HMO | Admitting: Physician Assistant

## 2016-01-30 ENCOUNTER — Ambulatory Visit (INDEPENDENT_AMBULATORY_CARE_PROVIDER_SITE_OTHER): Payer: Commercial Managed Care - HMO

## 2016-01-30 DIAGNOSIS — M545 Low back pain: Secondary | ICD-10-CM

## 2016-01-30 DIAGNOSIS — M533 Sacrococcygeal disorders, not elsewhere classified: Secondary | ICD-10-CM

## 2016-01-30 DIAGNOSIS — G8929 Other chronic pain: Secondary | ICD-10-CM

## 2016-01-30 MED ORDER — METHYLPREDNISOLONE ACETATE 40 MG/ML IJ SUSP
40.0000 mg | INTRAMUSCULAR | Status: AC | PRN
  Administered 2016-01-30: 40 mg via INTRAMUSCULAR

## 2016-01-30 MED ORDER — LIDOCAINE HCL 1 % IJ SOLN
1.0000 mL | INTRAMUSCULAR | Status: AC | PRN
  Administered 2016-01-30: 1 mL

## 2016-01-30 NOTE — Progress Notes (Signed)
   Office Visit Note   Patient: Juan Kennedy           Date of Birth: [birth year]           MRN: [id number] Visit Date: 01/30/2016              Requested by: Bobbye Charleston, MD [address], [place] [address] PCP: Bobbye Charleston, MD   Assessment & Plan: Visit Diagnoses:  1. Coccyx pain   2. Chronic low back pain, unspecified back pain laterality, with sciatica presence unspecified     Plan: He will follow up as needed or if pain continues or becomes worse.  Follow-Up Instructions: Return if symptoms worsen or fail to improve.   Orders:  Orders Placed This Encounter  Procedures  . XR Sacrum/Coccyx  . XR Lumbar Spine 2-3 Views   No orders of the defined types were placed in this encounter.     Procedures: Trigger Point Inj Date/Time: 01/30/2016 6:35 PM Performed by: Pete Pelt Authorized by: Pete Pelt   Total # of Trigger Points:  1 Location: back   Needle Size:  22 G Approach:  Dorsal Medications #1:  1 mL lidocaine 1 %; 40 mg methylPREDNISolone acetate 40 MG/ML Patient tolerance:  Patient tolerated the procedure well with no immediate complications     Clinical Data: No additional findings.   Subjective: Chief Complaint  Patient presents with  . Spine - Pain    Primarily coccyx pain when sitting for 2 weeks. No injury. No fall. Taking ibeprofren when sitting long periods of time    HPI  Mr. Juan Kennedy comes in today with new complaint of lower back /coccyx pain. No known injury. Pain has been present for the last 2 weeks. No bowel or bladder dysfunction. Pain is without improvement despite a prednisone Dosepak however ibuprofen does help some. He's having no awaking pain. Pain is worse when sitting.  Review of Systems   Objective: Vital Signs: There were no vitals taken for this visit.  Physical Exam Well-developed well-nourished male in no acute distress. Affect appropriate. Psychiatric alert and oriented 3. Ortho  Exam 5 Out of 5 strengths lower extremities throughout. Negative straight leg raise bilaterally tight hamstrings. He has no tenderness of the lower lumbar spine. Has tenderness over the sacrum right at the beginning of the gluteal crease. Specialty Comments:  No specialty comments available.  Imaging: Xr Sacrum/coccyx  Result Date: 01/30/2016 Sacrococcyx views multiple views: No acute fractures no bony abnormalities SI joints appear well maintained.  Xr Lumbar Spine 2-3 Views  Result Date: 01/30/2016 Lumbar spine 2 views: No acute fracture no bony abnormalities. Facet changes at L4-5 L5-S1. No spondylolisthesis.    PMFS History: There are no active problems to display for this patient.  Past Medical History:  Diagnosis Date  . High cholesterol   . Reflux     No family history on file.  Past Surgical History:  Procedure Laterality Date  . ROTATOR CUFF REPAIR    . SHOULDER SURGERY     Social History   Occupational History  . Not on file.   Social History Main Topics  . Smoking status: Never Smoker  . Smokeless tobacco: Never Used  . Alcohol use Not on file  . Drug use: Unknown  . Sexual activity: Not on file

## 2016-02-12 DIAGNOSIS — M542 Cervicalgia: Secondary | ICD-10-CM | POA: Diagnosis not present

## 2016-02-12 DIAGNOSIS — G243 Spasmodic torticollis: Secondary | ICD-10-CM | POA: Diagnosis not present

## 2016-02-25 DIAGNOSIS — M542 Cervicalgia: Secondary | ICD-10-CM | POA: Diagnosis not present

## 2016-02-25 DIAGNOSIS — G243 Spasmodic torticollis: Secondary | ICD-10-CM | POA: Diagnosis not present

## 2016-03-11 DIAGNOSIS — M542 Cervicalgia: Secondary | ICD-10-CM | POA: Diagnosis not present

## 2016-03-11 DIAGNOSIS — G243 Spasmodic torticollis: Secondary | ICD-10-CM | POA: Diagnosis not present

## 2016-03-24 DIAGNOSIS — R05 Cough: Secondary | ICD-10-CM | POA: Diagnosis not present

## 2016-03-24 DIAGNOSIS — J449 Chronic obstructive pulmonary disease, unspecified: Secondary | ICD-10-CM | POA: Diagnosis not present

## 2016-03-24 DIAGNOSIS — Z6825 Body mass index (BMI) 25.0-25.9, adult: Secondary | ICD-10-CM | POA: Diagnosis not present

## 2016-03-25 DIAGNOSIS — H524 Presbyopia: Secondary | ICD-10-CM | POA: Diagnosis not present

## 2016-03-25 DIAGNOSIS — H52223 Regular astigmatism, bilateral: Secondary | ICD-10-CM | POA: Diagnosis not present

## 2016-03-25 DIAGNOSIS — H5203 Hypermetropia, bilateral: Secondary | ICD-10-CM | POA: Diagnosis not present

## 2016-03-25 DIAGNOSIS — H04123 Dry eye syndrome of bilateral lacrimal glands: Secondary | ICD-10-CM | POA: Diagnosis not present

## 2016-03-25 DIAGNOSIS — H2513 Age-related nuclear cataract, bilateral: Secondary | ICD-10-CM | POA: Diagnosis not present

## 2016-03-27 DIAGNOSIS — R972 Elevated prostate specific antigen [PSA]: Secondary | ICD-10-CM | POA: Diagnosis not present

## 2016-03-27 DIAGNOSIS — N528 Other male erectile dysfunction: Secondary | ICD-10-CM | POA: Diagnosis not present

## 2016-03-27 DIAGNOSIS — N401 Enlarged prostate with lower urinary tract symptoms: Secondary | ICD-10-CM | POA: Diagnosis not present

## 2016-03-27 DIAGNOSIS — N138 Other obstructive and reflux uropathy: Secondary | ICD-10-CM | POA: Diagnosis not present

## 2016-03-31 DIAGNOSIS — D509 Iron deficiency anemia, unspecified: Secondary | ICD-10-CM | POA: Diagnosis not present

## 2016-03-31 DIAGNOSIS — E784 Other hyperlipidemia: Secondary | ICD-10-CM | POA: Diagnosis not present

## 2016-03-31 DIAGNOSIS — R8299 Other abnormal findings in urine: Secondary | ICD-10-CM | POA: Diagnosis not present

## 2016-04-03 DIAGNOSIS — G243 Spasmodic torticollis: Secondary | ICD-10-CM | POA: Diagnosis not present

## 2016-04-03 DIAGNOSIS — M542 Cervicalgia: Secondary | ICD-10-CM | POA: Diagnosis not present

## 2016-04-07 DIAGNOSIS — Z0000 Encounter for general adult medical examination without abnormal findings: Secondary | ICD-10-CM | POA: Diagnosis not present

## 2016-04-07 DIAGNOSIS — Z6825 Body mass index (BMI) 25.0-25.9, adult: Secondary | ICD-10-CM | POA: Diagnosis not present

## 2016-04-07 DIAGNOSIS — I2584 Coronary atherosclerosis due to calcified coronary lesion: Secondary | ICD-10-CM | POA: Diagnosis not present

## 2016-04-07 DIAGNOSIS — L989 Disorder of the skin and subcutaneous tissue, unspecified: Secondary | ICD-10-CM | POA: Diagnosis not present

## 2016-04-07 DIAGNOSIS — G243 Spasmodic torticollis: Secondary | ICD-10-CM | POA: Diagnosis not present

## 2016-04-07 DIAGNOSIS — D509 Iron deficiency anemia, unspecified: Secondary | ICD-10-CM | POA: Diagnosis not present

## 2016-04-07 DIAGNOSIS — J449 Chronic obstructive pulmonary disease, unspecified: Secondary | ICD-10-CM | POA: Diagnosis not present

## 2016-04-07 DIAGNOSIS — K219 Gastro-esophageal reflux disease without esophagitis: Secondary | ICD-10-CM | POA: Diagnosis not present

## 2016-04-07 DIAGNOSIS — E784 Other hyperlipidemia: Secondary | ICD-10-CM | POA: Diagnosis not present

## 2016-04-08 DIAGNOSIS — G243 Spasmodic torticollis: Secondary | ICD-10-CM | POA: Diagnosis not present

## 2016-04-08 DIAGNOSIS — M542 Cervicalgia: Secondary | ICD-10-CM | POA: Diagnosis not present

## 2016-04-15 DIAGNOSIS — M542 Cervicalgia: Secondary | ICD-10-CM | POA: Diagnosis not present

## 2016-04-15 DIAGNOSIS — G243 Spasmodic torticollis: Secondary | ICD-10-CM | POA: Diagnosis not present

## 2016-04-16 DIAGNOSIS — Z1212 Encounter for screening for malignant neoplasm of rectum: Secondary | ICD-10-CM | POA: Diagnosis not present

## 2016-04-30 DIAGNOSIS — M542 Cervicalgia: Secondary | ICD-10-CM | POA: Diagnosis not present

## 2016-04-30 DIAGNOSIS — G243 Spasmodic torticollis: Secondary | ICD-10-CM | POA: Diagnosis not present

## 2016-05-04 DIAGNOSIS — H2511 Age-related nuclear cataract, right eye: Secondary | ICD-10-CM | POA: Diagnosis not present

## 2016-05-04 DIAGNOSIS — H2512 Age-related nuclear cataract, left eye: Secondary | ICD-10-CM | POA: Diagnosis not present

## 2016-05-06 DIAGNOSIS — G243 Spasmodic torticollis: Secondary | ICD-10-CM | POA: Diagnosis not present

## 2016-05-06 DIAGNOSIS — M542 Cervicalgia: Secondary | ICD-10-CM | POA: Diagnosis not present

## 2016-05-07 DIAGNOSIS — C44319 Basal cell carcinoma of skin of other parts of face: Secondary | ICD-10-CM | POA: Diagnosis not present

## 2016-05-07 DIAGNOSIS — L821 Other seborrheic keratosis: Secondary | ICD-10-CM | POA: Diagnosis not present

## 2016-05-07 DIAGNOSIS — D1722 Benign lipomatous neoplasm of skin and subcutaneous tissue of left arm: Secondary | ICD-10-CM | POA: Diagnosis not present

## 2016-05-07 DIAGNOSIS — L308 Other specified dermatitis: Secondary | ICD-10-CM | POA: Diagnosis not present

## 2016-05-08 DIAGNOSIS — R195 Other fecal abnormalities: Secondary | ICD-10-CM | POA: Diagnosis not present

## 2016-05-14 DIAGNOSIS — G243 Spasmodic torticollis: Secondary | ICD-10-CM | POA: Diagnosis not present

## 2016-05-14 DIAGNOSIS — M542 Cervicalgia: Secondary | ICD-10-CM | POA: Diagnosis not present

## 2016-05-18 DIAGNOSIS — Z6824 Body mass index (BMI) 24.0-24.9, adult: Secondary | ICD-10-CM | POA: Diagnosis not present

## 2016-05-18 DIAGNOSIS — K644 Residual hemorrhoidal skin tags: Secondary | ICD-10-CM | POA: Diagnosis not present

## 2016-05-19 ENCOUNTER — Encounter: Payer: Self-pay | Admitting: Physician Assistant

## 2016-05-20 DIAGNOSIS — L0291 Cutaneous abscess, unspecified: Secondary | ICD-10-CM | POA: Diagnosis not present

## 2016-05-20 DIAGNOSIS — M542 Cervicalgia: Secondary | ICD-10-CM | POA: Diagnosis not present

## 2016-05-20 DIAGNOSIS — G243 Spasmodic torticollis: Secondary | ICD-10-CM | POA: Diagnosis not present

## 2016-05-20 DIAGNOSIS — Z6824 Body mass index (BMI) 24.0-24.9, adult: Secondary | ICD-10-CM | POA: Diagnosis not present

## 2016-05-25 DIAGNOSIS — H25812 Combined forms of age-related cataract, left eye: Secondary | ICD-10-CM | POA: Diagnosis not present

## 2016-05-25 DIAGNOSIS — H2512 Age-related nuclear cataract, left eye: Secondary | ICD-10-CM | POA: Diagnosis not present

## 2016-05-29 ENCOUNTER — Encounter: Payer: Self-pay | Admitting: Physician Assistant

## 2016-05-29 ENCOUNTER — Encounter (INDEPENDENT_AMBULATORY_CARE_PROVIDER_SITE_OTHER): Payer: Self-pay

## 2016-05-29 ENCOUNTER — Ambulatory Visit (INDEPENDENT_AMBULATORY_CARE_PROVIDER_SITE_OTHER): Payer: Medicare HMO | Admitting: Physician Assistant

## 2016-05-29 VITALS — BP 134/72 | HR 70 | Wt 159.0 lb

## 2016-05-29 DIAGNOSIS — K648 Other hemorrhoids: Secondary | ICD-10-CM

## 2016-05-29 DIAGNOSIS — K644 Residual hemorrhoidal skin tags: Secondary | ICD-10-CM | POA: Diagnosis not present

## 2016-05-29 DIAGNOSIS — K625 Hemorrhage of anus and rectum: Secondary | ICD-10-CM | POA: Diagnosis not present

## 2016-05-29 NOTE — Progress Notes (Signed)
Chief Complaint: Hemorrhoids, rectal pain, rectal bleeding  HPI:  Juan Kennedy is a 76 year old Caucasian male with a past medical history as listed below as well as cervical dystonia, who was referred to me by Velna Hatchet, MD for a complaint of hemorrhoids, rectal pain and rectal bleeding.     Patient brings with him his most recent EGD and colonoscopy report performed 03/17/13. These were performed by Dr. Jerene Pitch at [facility]. EGD showed normal stomach, normal duodenum, normal Z line, small hiatal hernia at the GE junction and a hiatal hernia. Colonoscopy on that date showed a 5 mm polyp in the descending colon. This is found to be adenomatous and patient was told to return in 5 years for follow-up. As well as moderate diverticulosis in the descending colon and retroflexion view revealed no abnormalities.   Recent labs completed 05/08/2016 showed a Hemoccult that was negative but hemosure that was positive. Iron studies completed 03/31/16 were normal. CMP was normal as well as a CBC on that date.   Today, the patient begins by explaining that he had a very painful hemorrhoidectomy in the 1990s and had pain for the next 2 years following this procedure as well as occasional bleeding. He then seemed to have only intermittent problems with what he believes were still internal hemorrhoids, which he treated with cortisone suppositories and a daily fiber supplement. Patient tells me that occasionally he would still have a hard stool and would see some bright red blood on the toilet paper and this is when he would uses suppositories. Then about 6 weeks ago the patient passed a somewhat hard stool and was not able to "gain control back" after this episode with hydrocortisone suppositories, fiber supplement or any other therapy. He continued with some rectal pressure and bright red blood on the toilet paper for weeks, he was then prescribed hydrocortisone suppositories by his PCP and made some  "progress", but still had occasional bleeding and pain. He then went to his PCP and was told to start warm water baths for an unrelated problem of a spider bite on his leg and found that doing these warm water baths for 10 minutes a day twice daily also helped with his hemorrhoids. Tells me he has been doing this over the past week and just recently has decreased in frequency of his baths and his hemorrhoids are much better. He has not seen any bleeding over the past week or so. He also has decreased pain over this time.   Patient denies fever, chills, weight loss, fatigue, anorexia, change in bowel habits, nausea, vomiting, heartburn, reflux or symptoms that awaken him at night.  Past Medical History:  Diagnosis Date  . Arthritis   . High cholesterol   . Reflux     Past Surgical History:  Procedure Laterality Date  . HEMORRHOID SURGERY    . ROTATOR CUFF REPAIR    . SHOULDER SURGERY      Current Outpatient Prescriptions  Medication Sig Dispense Refill  . atorvastatin (LIPITOR) 20 MG tablet Take 20 mg by mouth daily.     . Calcium-Magnesium-Vitamin D (CALCIUM 1200+D3 PO) Take 1 tablet by mouth daily.    . clonazePAM (KLONOPIN) 1 MG tablet 2 mg 2 (two) times daily.     Marland Kitchen desonide (DESOWEN) 0.05 % cream Apply 1 application topically as needed.   11  . doxazosin (CARDURA) 4 MG tablet Take 4 mg by mouth daily.     Noelle Penner FIBER SUPPLEMENT PO  Take 2 tablets by mouth at bedtime.    . hydrocortisone 2.5 % cream Apply topically 2 (two) times daily.    . naproxen (NAPROSYN) 500 MG tablet Take 500 mg by mouth as needed.     . pantoprazole (PROTONIX) 40 MG tablet Take 40 mg by mouth daily.    . temazepam (RESTORIL) 15 MG capsule Take 15 mg by mouth as needed for sleep.    Marland Kitchen tiZANidine (ZANAFLEX) 4 MG tablet Take 4 mg by mouth 2 (two) times daily.      No current facility-administered medications for this visit.     Allergies as of 05/29/2016 - Review Complete 01/30/2016  Allergen Reaction Noted    . Sulfa antibiotics Rash 09/14/2013    Family History  Problem Relation Age of Onset  . Lung cancer Father     Social History   Social History  . Marital status: Married    Spouse name: N/A  . Number of children: N/A  . Years of education: N/A   Occupational History  . Not on file.   Social History Main Topics  . Smoking status: Never Smoker  . Smokeless tobacco: Never Used  . Alcohol use No  . Drug use: No  . Sexual activity: Not on file   Other Topics Concern  . Not on file   Social History Narrative  . No narrative on file    Review of Systems:    Constitutional: No weight loss, fever or chills Skin: No rash  Cardiovascular: No chest pain Respiratory: No SOB  Gastrointestinal: See HPI and otherwise negative Genitourinary: No dysuria  Neurological: No headache, dizziness or syncope Musculoskeletal: No new muscle or joint pain Hematologic: No bruising Psychiatric: No history of depression or anxiety   Physical Exam:  Vital signs: BP 134/72   Pulse 70   Wt 159 lb (72.1 kg)   BMI 24.18 kg/m   Constitutional:   Very pleasant Caucasian male appears to be in NAD, Well developed, Well nourished, alert and cooperative Head:  Normocephalic and atraumatic. Eyes:   PEERL, EOMI. No icterus. Conjunctiva pink. Ears:  Normal auditory acuity. Neck:  Supple Throat: Oral cavity and pharynx without inflammation, swelling or lesion.  Respiratory: Respirations even and unlabored. Lungs clear to auscultation bilaterally.   No wheezes, crackles, or rhonchi.  Cardiovascular: Normal S1, S2. No MRG. Regular rate and rhythm. No peripheral edema, cyanosis or pallor.  Gastrointestinal:  Soft, nondistended, nontender. No rebound or guarding. Normal bowel sounds. No appreciable masses or hepatomegaly. Rectal:  External exam: multiple hemorrhoid tags and external hemorrhoids, non-inflamed, non-tender; Anoscopy: Grade 1-2 internal hemorrhoids, no obvious bleeding  Msk:  Symmetrical  without gross deformities. Without edema, no deformity or joint abnormality.dystonic motions of head/neck  Neurologic:  Alert and  oriented x4;  grossly normal neurologically.  Skin:   Dry and intact without significant lesions or rashes. Psychiatric: Demonstrates good judgement and reason without abnormal affect or behaviors.  See HPI  Assessment: 1. Hemorrhoids: Last colon 2015 with no sign of internal hemorrhoids, previous hemorrhoidectomy in the 90s, now patient with increased symptoms of bright red blood per rectum and fullness with rectal pain over the past 6 weeks, somewhat better over the past week after suppositories and sitz baths, patient would like to see if banding would be an option for him 2. Bright red blood per rectum: Thought related to above  Plan: 1. Discussed patient with Dr. Havery Moros at time of patient's visit, who accepts the patient 2. Per Dr.  Armbruster patient is to be set up for a banding appointment. Discussed this with the patient. Discussed risks, benefits, limitations and alternatives and patient agrees to proceed. 3. In the interim patient is to continue his fiber supplementation and at least 6-8 8 ounce glasses of water per day. Did discuss that if this does not give him regular bowel movements he can add MiraLAX. We discussed titration of this. 4. Patient to continue suppositories as needed for hemorrhoids, did discuss that he should not continue these for longer than 2 weeks as this can thin his rectal tissue 5. Patient to follow in clinic per Dr. Doyne Keel recommendations after time of procedure.  At least 80 minutes was spent in consultation and coordination of care for this patient.  Ellouise Newer, PA-C [facility] 05/29/2016, 3:43 PM  Cc: Velna Hatchet, MD

## 2016-05-29 NOTE — Patient Instructions (Signed)
We have given you a handout on sitz baths.   We have set you up for hemorrhoid banding. Please call if you need to reschedule.

## 2016-05-29 NOTE — Progress Notes (Signed)
Agree with assessment and plan. Symptoms c/w symptomatic hemorrhoids which are noted on anoscopy. Relatively recent colonoscopy without high risk lesions. I think a banding procedure is reasonable given his symptoms persist, and scheduled for this. If no improvement with banding will consider repeat colonoscopy but think it unlikely to be of high yield at this time.

## 2016-06-08 ENCOUNTER — Telehealth (INDEPENDENT_AMBULATORY_CARE_PROVIDER_SITE_OTHER): Payer: Self-pay | Admitting: Physical Medicine and Rehabilitation

## 2016-06-09 MED ORDER — TRAMADOL HCL 50 MG PO TABS: 50.0000 mg | ORAL_TABLET | Freq: Three times a day (TID) | ORAL | 0 refills | Status: DC | PRN

## 2016-06-09 NOTE — Telephone Encounter (Signed)
Prescription faxed to patient's preferred pharmacy.

## 2016-06-09 NOTE — Telephone Encounter (Signed)
Tramadol can be faxed. I printed

## 2016-06-09 NOTE — Telephone Encounter (Signed)
T expected for office visit for neck pain requested pain medication, have seen him in hte past. Tramadol order placed

## 2016-06-10 DIAGNOSIS — H25811 Combined forms of age-related cataract, right eye: Secondary | ICD-10-CM | POA: Diagnosis not present

## 2016-06-10 DIAGNOSIS — H2511 Age-related nuclear cataract, right eye: Secondary | ICD-10-CM | POA: Diagnosis not present

## 2016-06-16 DIAGNOSIS — Z85828 Personal history of other malignant neoplasm of skin: Secondary | ICD-10-CM | POA: Diagnosis not present

## 2016-06-16 DIAGNOSIS — C44319 Basal cell carcinoma of skin of other parts of face: Secondary | ICD-10-CM | POA: Diagnosis not present

## 2016-06-18 ENCOUNTER — Ambulatory Visit (INDEPENDENT_AMBULATORY_CARE_PROVIDER_SITE_OTHER): Payer: Medicare HMO

## 2016-06-18 ENCOUNTER — Encounter (INDEPENDENT_AMBULATORY_CARE_PROVIDER_SITE_OTHER): Payer: Self-pay | Admitting: Physical Medicine and Rehabilitation

## 2016-06-18 ENCOUNTER — Telehealth (INDEPENDENT_AMBULATORY_CARE_PROVIDER_SITE_OTHER): Payer: Self-pay | Admitting: Physical Medicine and Rehabilitation

## 2016-06-18 ENCOUNTER — Ambulatory Visit: Payer: Self-pay | Admitting: Internal Medicine

## 2016-06-18 ENCOUNTER — Ambulatory Visit (INDEPENDENT_AMBULATORY_CARE_PROVIDER_SITE_OTHER): Payer: Medicare HMO | Admitting: Physical Medicine and Rehabilitation

## 2016-06-18 VITALS — BP 148/84 | HR 62

## 2016-06-18 DIAGNOSIS — M609 Myositis, unspecified: Secondary | ICD-10-CM

## 2016-06-18 DIAGNOSIS — M542 Cervicalgia: Secondary | ICD-10-CM

## 2016-06-18 DIAGNOSIS — M47812 Spondylosis without myelopathy or radiculopathy, cervical region: Secondary | ICD-10-CM | POA: Diagnosis not present

## 2016-06-18 NOTE — Progress Notes (Deleted)
Had been doing PT for neck starting about a year ago. Had to stop from about August until January due to medical issues with wife. Went back for a while, but neck pain increased. The pain is constant in area of base of skull, mainly on the right. Has not been to PT and has been inactive for about 4 weeks, and the pain has decreased some. Had dry needling at PT which helped at first, but feels this began to make the pain worse.

## 2016-06-18 NOTE — Progress Notes (Signed)
Juan Kennedy - 76 y.o. male MRN [id number]  Date of birth: [birth year]  Office Visit Note: Visit Date: 06/18/2016 PCP: Velna Hatchet, MD Referred by: Velna Hatchet, MD  Subjective: Chief Complaint  Patient presents with  . Neck - Pain   HPI: Juan Kennedy is a 76 year old gentleman that I have seen in the past for his lumbar spine. A history of facet arthropathy with facet joint cyst that was very amenable to intermittent joint aspiration and injection. We have not seen him in probably 2 years and is been doing fine. He comes in today with no real back pain. He tells a story though of his neck pain and neck problems. He has had some torticollis of the cervical spine. He was seen a physical therapist, Verdene Rio at [place] [facility] last year and was doing extremely well and then there was a lot of medical issues in his family. His wife had a hip replacement as well as knee replacement. She then ultimately had a fall and broken left foot. He reports that she's doing fine but they're all that series of events he went from having just poor control of his neck taxi having increased pain of the right neck. Basically he had stopped going to physical therapy from August to June of last year through the medical issues and when he went back to have physical therapy it took not long before he had increasing pain. Initially didn't report much pain when he was seeing them it was more range of motion and control issues of the cervical spine. Once he started having the pain that was more mechanical treatment as well as manual treatment and dry needling. None of this seemed actually help. He says ironically he has not really been going to therapy for the last 4 weeks has been fairly inactive and he is actually feeling somewhat better. The pain is constant and radiates from the lower cervical spine at the base of the skull. It is mainly on the right side. He has no referral pattern in the arms. He  denies any paresthesias or numbness or tingling. He denies any focal weakness. He does have difficulty using his laptop computer because he has pain while trying to look at the monitor. We actually prescribe some tramadol recently that he said did not help very much. He's been using ibuprofen and ice helped more than anything else. He has used muscle relaxer the past without much relief. He has not any imaging of the cervical spine recently so we did get cervical spine today and this is reviewed below. He has had no prior cervical surgery ordered mast imaging.    Review of Systems  Constitutional: Negative for chills, fever, malaise/fatigue and weight loss.  HENT: Negative for hearing loss and sinus pain.   Eyes: Negative for blurred vision, double vision and photophobia.  Respiratory: Negative for cough and shortness of breath.   Cardiovascular: Negative for chest pain, palpitations and leg swelling.  Gastrointestinal: Negative for abdominal pain, nausea and vomiting.  Genitourinary: Negative for flank pain.  Musculoskeletal: Positive for neck pain. Negative for myalgias.  Skin: Negative for itching and rash.  Neurological: Negative for tremors, focal weakness and weakness.  Endo/Heme/Allergies: Negative.   Psychiatric/Behavioral: Negative for depression.  All other systems reviewed and are negative.  Otherwise per HPI.  Assessment & Plan: Visit Diagnoses:  1. Neck pain   2. Cervical spondylosis without myelopathy   3. Myofascitis     Plan: Findings:  Chronic  approximately 1 year history of increasing right-sided neck pain. This is in the setting of some torticollis and spasticity of the cervical spine. Prior to this he was not having much pain but he just wanted to be in therapy to get more control of his neck. He wanted to strengthen his muscles at that point. He was doing quite well and then all of a sudden the pain began to flareup. It's worse with extension and rotation. Exam is  consistent with facet mediated pain and myofascial pain together. Cervical spine films today show pretty advanced spondylosis particularly at the C4-5 C5-C6 and C6-7 region. There are degenerative disc changes at C6-7 in particular. I think at this point with failing conservative care and pain medication and would be wise to complete diagnostic and therapeutic right-sided C4-5 C5-66-7 facet joint blocks. If he does well with that we ultimately could think about radiofrequency ablation. He would also help with the therapist may be to calm things down so he can continue to help him strengthen. We also talked about activity modification and him sitting with a pronounced forward flexed spine. We've suggested some ideas to help him with that.    Meds & Orders: No orders of the defined types were placed in this encounter.   Orders Placed This Encounter  Procedures  . XR Cervical Spine 2 or 3 views    Follow-up: Return for Right C4-5, C5-6 and C6-7 facet joint blocks.   Procedures: No procedures performed  No notes on file   Clinical History: No specialty comments available.  He reports that he has never smoked. He has never used smokeless tobacco. No results for input(s): HGBA1C, LABURIC in the last 8760 hours.  Objective:  VS:  HT:    WT:   BMI:     BP:(!) 148/84  HR:62bpm  TEMP: ( )  RESP:  Physical Exam  Constitutional: He is oriented to person, place, and time. He appears well-developed and well-nourished. No distress.  HENT:  Head: Normocephalic and atraumatic.  Eyes: Conjunctivae are normal. Pupils are equal, round, and reactive to light.  Neck: Normal range of motion. Neck supple.  Cardiovascular: Regular rhythm and intact distal pulses.   Pulmonary/Chest: Effort normal. No respiratory distress.  Musculoskeletal:  Cervical spine is with increased forward flexion due to kyphosis of the thoracic spine. He actually has good lordosis of the cervical spine has pain with right rotation  more than left. Pain with extension. He has a negative Spurling's test. He does have very tight paraspinal musculature without a focal trigger point. He has pretty good motion of the shoulders bilaterally without impingement. He has good strength in the upper extreme is bilaterally. There is a negative Hoffmann's test bilaterally.  Neurological: He is alert and oriented to person, place, and time. He exhibits abnormal muscle tone. Coordination normal.  Skin: Skin is warm and dry. No rash noted. No erythema.  Psychiatric: He has a normal mood and affect.  Nursing note and vitals reviewed.   Ortho Exam Imaging: Xr Cervical Spine 2 Or 3 Views  Result Date: 06/18/2016 4 view cervical spine shows on AP multilevel facet arthropathy and uncovertebral joint hypertrophy right more than left. There are more findings in the mid cervical region C5 to 6 and C6-7. There is some rotatory component but otherwise normal AP alignment. Lateral view shows good cervical lordosis with may be a tiny anterior listhesis of C4 on C5. There are degenerative disc changes at C5-6 and C6-7. Oblique views show  some foraminal narrowing in the upper cervical spine at the C6-7 region.   Past Medical/Family/Surgical/Social History: Medications & Allergies reviewed per EMR There are no active problems to display for this patient.  Past Medical History:  Diagnosis Date  . Arthritis   . High cholesterol   . Reflux    Family History  Problem Relation Age of Onset  . Lung cancer Father    Past Surgical History:  Procedure Laterality Date  . HEMORRHOID SURGERY    . ROTATOR CUFF REPAIR    . SHOULDER SURGERY     Social History   Occupational History  . Not on file.   Social History Main Topics  . Smoking status: Never Smoker  . Smokeless tobacco: Never Used  . Alcohol use No  . Drug use: No  . Sexual activity: Not on file

## 2016-06-19 NOTE — Telephone Encounter (Signed)
Faxed auth form to [employer]

## 2016-06-25 NOTE — Telephone Encounter (Signed)
Scheduled for 07/08/16 pending auth.

## 2016-06-26 NOTE — Telephone Encounter (Signed)
Received auth. [id number]. eff 06/19/16-08/03/16.

## 2016-06-30 DIAGNOSIS — Z6825 Body mass index (BMI) 25.0-25.9, adult: Secondary | ICD-10-CM | POA: Diagnosis not present

## 2016-06-30 DIAGNOSIS — B0229 Other postherpetic nervous system involvement: Secondary | ICD-10-CM | POA: Diagnosis not present

## 2016-06-30 DIAGNOSIS — K429 Umbilical hernia without obstruction or gangrene: Secondary | ICD-10-CM | POA: Diagnosis not present

## 2016-07-01 ENCOUNTER — Telehealth (INDEPENDENT_AMBULATORY_CARE_PROVIDER_SITE_OTHER): Payer: Self-pay | Admitting: Physical Medicine and Rehabilitation

## 2016-07-01 NOTE — Telephone Encounter (Signed)
Where are he shingles located but in general should be ok

## 2016-07-01 NOTE — Telephone Encounter (Signed)
The shingles are on his right side, but do not extend to shoulder or neck. They stop maybe mid way up his side. I told him we should be ok with this. Please advise if I need to tell him anything different.

## 2016-07-02 NOTE — Telephone Encounter (Signed)
All ok

## 2016-07-07 ENCOUNTER — Encounter: Payer: Medicare HMO | Admitting: Gastroenterology

## 2016-07-08 ENCOUNTER — Encounter (INDEPENDENT_AMBULATORY_CARE_PROVIDER_SITE_OTHER): Payer: Self-pay | Admitting: Physical Medicine and Rehabilitation

## 2016-07-08 ENCOUNTER — Ambulatory Visit (INDEPENDENT_AMBULATORY_CARE_PROVIDER_SITE_OTHER): Payer: Medicare HMO | Admitting: Physical Medicine and Rehabilitation

## 2016-07-08 ENCOUNTER — Ambulatory Visit (INDEPENDENT_AMBULATORY_CARE_PROVIDER_SITE_OTHER): Payer: Self-pay

## 2016-07-08 VITALS — BP 118/70 | HR 68 | Temp 98.0°F

## 2016-07-08 DIAGNOSIS — M542 Cervicalgia: Secondary | ICD-10-CM

## 2016-07-08 DIAGNOSIS — M47812 Spondylosis without myelopathy or radiculopathy, cervical region: Secondary | ICD-10-CM | POA: Diagnosis not present

## 2016-07-08 MED ORDER — METHYLPREDNISOLONE ACETATE 80 MG/ML IJ SUSP
80.0000 mg | Freq: Once | INTRAMUSCULAR | Status: AC
  Administered 2016-07-08: 80 mg

## 2016-07-08 MED ORDER — LIDOCAINE HCL (PF) 1 % IJ SOLN
2.0000 mL | Freq: Once | INTRAMUSCULAR | Status: AC
  Administered 2016-07-08: 2 mL

## 2016-07-08 NOTE — Procedures (Signed)
Cervical Facet Joint Intra-Articular Injection with Fluoroscopic Guidance  Patient: Juan Kennedy      Date of Birth: February 14, 1940 MRN: [id number] PCP: Velna Hatchet, MD      Visit Date: 07/08/2016   Mr. Juan Kennedy is a 76 year old gentleman who comes in today for planned right sided facet joint blocks. Please see our prior evaluation and management note for further details and justification. Since I've seen him last he has unfortunately had an outbreak of shingles. This is in a T10 dermatome. He has been treated for this with anti-viral's as well as other medication and gabapentin. He is on 300 mg of gabapentin 3 times a day. He is still having axial neck pain. 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 He is doing somewhat better from that.  Universal Protocol:    Date/Time: 06/06/182:59 PM  Consent Given By: the patient  Position: PRONE  Additional Comments: Vital signs were monitored before and after the procedure. Patient was prepped and draped in the usual sterile fashion. The correct patient, procedure, and site was verified.   Injection Procedure Details:  Procedure Site One Meds Administered:  Meds ordered this encounter  Medications  . lidocaine (PF) (XYLOCAINE) 1 % injection 2 mL  . methylPREDNISolone acetate (DEPO-MEDROL) injection 80 mg     Laterality: Right  Location/Site:  C4-5 C5-6 C6-7  Needle size: 22 G  Needle type: Spinal  Needle Placement: Articular  Findings:  -Contrast Used: 1 mL iohexol 180 mg iodine/mL   -Comments: Excellent flow of contrast producing a partial arthrogram.  Procedure Details: The region overlying the facet joints mentioned above were localized under fluoroscopic visualization. The needle was inserted down to the level of the lateral mass of the superior articular process of the facet joint to be injected. Then, the needle was  "walked off" inferiorly into the lateral aspect of the facet joint. Bi-planar images were used for confirming placement and spot radiographs were documented.  A 0.25 ml volume of Omnipaque-240 was injected into the facet joint and a standard partial arthrogram was obtained. Radiographs were obtained of the arthrogram. A 0.5 ml. volume of the steroid/anesthetic solution was injected into the joint. This procedure was repeated for each facet joint injected.   Additional Comments:  The patient tolerated the procedure well Dressing: Band-Aid    Post-procedure details: Patient was observed during the procedure. Post-procedure instructions were reviewed.  Patient left the clinic in stable condition.

## 2016-07-08 NOTE — Progress Notes (Deleted)
Patient is here today for planned right C4-5, C5-6, and C6-7 facet injection. No change in symptoms.

## 2016-07-08 NOTE — Patient Instructions (Signed)

## 2016-07-10 DIAGNOSIS — R972 Elevated prostate specific antigen [PSA]: Secondary | ICD-10-CM | POA: Diagnosis not present

## 2016-07-21 ENCOUNTER — Encounter: Payer: Medicare HMO | Admitting: Gastroenterology

## 2016-08-04 ENCOUNTER — Telehealth (INDEPENDENT_AMBULATORY_CARE_PROVIDER_SITE_OTHER): Payer: Self-pay | Admitting: Physical Medicine and Rehabilitation

## 2016-08-04 DIAGNOSIS — M542 Cervicalgia: Secondary | ICD-10-CM

## 2016-08-04 DIAGNOSIS — M47812 Spondylosis without myelopathy or radiculopathy, cervical region: Secondary | ICD-10-CM

## 2016-08-06 ENCOUNTER — Encounter: Payer: Medicare HMO | Admitting: Gastroenterology

## 2016-08-10 NOTE — Telephone Encounter (Signed)
Patient wants to proceed with MRI.

## 2016-08-10 NOTE — Telephone Encounter (Signed)
His options would be to continue to see [name] at [facility] physical therapy and length of the facet joint blocks didn't help much and see if they could still helping. Otherwise I did look at an MRI of the cervical spine. If he wants to get that done and we can order that.

## 2016-08-10 NOTE — Telephone Encounter (Signed)
Called patient and left message for him to call back to discuss.

## 2016-08-11 NOTE — Telephone Encounter (Signed)
MRI ordered

## 2016-08-26 ENCOUNTER — Telehealth (INDEPENDENT_AMBULATORY_CARE_PROVIDER_SITE_OTHER): Payer: Self-pay | Admitting: Radiology

## 2016-08-26 NOTE — Telephone Encounter (Signed)
Ok thanks 

## 2016-08-26 NOTE — Telephone Encounter (Signed)
FYI---Patient wanted to call and let you know that he was going to acupuncture on Thursday this week at [facility]. And his MRI is scheduled for this Saturday.

## 2016-08-27 ENCOUNTER — Other Ambulatory Visit: Payer: Medicare HMO

## 2016-08-29 ENCOUNTER — Ambulatory Visit
Admission: RE | Admit: 2016-08-29 | Discharge: 2016-08-29 | Disposition: A | Discharge status: Home / Self Care | Payer: Medicare HMO | Source: Ambulatory Visit | Attending: Physical Medicine and Rehabilitation | Admitting: Physical Medicine and Rehabilitation

## 2016-08-29 DIAGNOSIS — M47812 Spondylosis without myelopathy or radiculopathy, cervical region: Secondary | ICD-10-CM

## 2016-08-29 DIAGNOSIS — M50323 Other cervical disc degeneration at C6-C7 level: Secondary | ICD-10-CM | POA: Diagnosis not present

## 2016-08-29 DIAGNOSIS — M542 Cervicalgia: Secondary | ICD-10-CM

## 2016-08-29 DIAGNOSIS — M4802 Spinal stenosis, cervical region: Secondary | ICD-10-CM | POA: Diagnosis not present

## 2016-09-03 ENCOUNTER — Ambulatory Visit (INDEPENDENT_AMBULATORY_CARE_PROVIDER_SITE_OTHER): Payer: Medicare HMO | Admitting: Physical Medicine and Rehabilitation

## 2016-09-03 ENCOUNTER — Telehealth (INDEPENDENT_AMBULATORY_CARE_PROVIDER_SITE_OTHER): Payer: Self-pay

## 2016-09-03 ENCOUNTER — Encounter (INDEPENDENT_AMBULATORY_CARE_PROVIDER_SITE_OTHER): Payer: Self-pay | Admitting: Physical Medicine and Rehabilitation

## 2016-09-03 VITALS — BP 121/77 | HR 70

## 2016-09-03 DIAGNOSIS — M436 Torticollis: Secondary | ICD-10-CM | POA: Diagnosis not present

## 2016-09-03 DIAGNOSIS — M47812 Spondylosis without myelopathy or radiculopathy, cervical region: Secondary | ICD-10-CM | POA: Diagnosis not present

## 2016-09-03 DIAGNOSIS — M542 Cervicalgia: Secondary | ICD-10-CM

## 2016-09-03 DIAGNOSIS — M609 Myositis, unspecified: Secondary | ICD-10-CM | POA: Diagnosis not present

## 2016-09-03 NOTE — Progress Notes (Deleted)
Patient returns to review MRI Cervical Spine.

## 2016-09-03 NOTE — Telephone Encounter (Signed)
Needs [employer] precert for Rt Z9-9 facet. Scheduled for 09/14/16 @ 8:30.

## 2016-09-07 ENCOUNTER — Encounter (INDEPENDENT_AMBULATORY_CARE_PROVIDER_SITE_OTHER): Payer: Self-pay | Admitting: Physical Medicine and Rehabilitation

## 2016-09-07 DIAGNOSIS — M542 Cervicalgia: Secondary | ICD-10-CM | POA: Insufficient documentation

## 2016-09-07 DIAGNOSIS — M436 Torticollis: Secondary | ICD-10-CM | POA: Insufficient documentation

## 2016-09-07 DIAGNOSIS — M609 Myositis, unspecified: Secondary | ICD-10-CM | POA: Insufficient documentation

## 2016-09-07 DIAGNOSIS — M47812 Spondylosis without myelopathy or radiculopathy, cervical region: Secondary | ICD-10-CM | POA: Insufficient documentation

## 2016-09-07 NOTE — Progress Notes (Signed)
Juan Kennedy - 76 y.o. male MRN [id number]  Date of birth: [birth year]  Office Visit Note: Visit Date: 09/03/2016 PCP: Velna Hatchet, MD Referred by: Velna Hatchet, MD  Subjective: Chief Complaint  Patient presents with  . Neck - Follow-up, Pain   HPI: Mr. Juan Kennedy and is a very pleasant 76 year old right-hand dominant gentleman who I recently saw for evaluation of his cervical spine and chronic cervicalgia. He clearly has some myofascial pain component and had done well with dry needling and physical therapy. He had this component of right sided axial neck pain at about the midportion of the cervical spine which was very problematic with twisting his head to the right and extension. We felt like this was facet mediated I did complete facet joint blocks of the middle facet joints without really much relief at all. He continues to have the same symptoms. He does get some radiating pattern in the occipital area of the head. No real associated headaches. 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 He is followed by a neurologist for this. We did obtain an MRI of the cervical spine which is reviewed below. There are no significant areas of significant stenosis or disc herniation. 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    Review of Systems  Constitutional: Negative for chills, fever, malaise/fatigue and weight loss.  HENT: Negative for hearing loss and sinus pain.   Eyes: Negative for blurred vision, double vision and photophobia.  Respiratory: Negative for cough and shortness of breath.   Cardiovascular: Negative for chest pain, palpitations and leg swelling.  Gastrointestinal: Negative for abdominal pain, nausea and vomiting.  Genitourinary: Negative for flank pain.  Musculoskeletal: Positive for neck pain. Negative for myalgias.  Skin: Negative for itching and rash.  Neurological: Negative for tremors, focal  weakness and weakness.  Endo/Heme/Allergies: Negative.   Psychiatric/Behavioral: Negative for depression.  All other systems reviewed and are negative.  Otherwise per HPI.  Assessment & Plan: Visit Diagnoses:  1. Cervicalgia   2. Cervical spondylosis without myelopathy   3. Myofascitis   4. Torticollis     Plan: Findings:  Chronic worsening severe right axial neck pain worse with rotation and extension. MRI evidence of severe arthritis with edema at C2-3 on the right. I think the best approach is a right C2-3 diagnostic facet joint block. He potentially look at radiofrequency ablation in the future. He has completed facet joint blocks in the past but these were lower down based on his symptoms alone. The MRI has seemingly probably identified his source of pain at this point. He'll continue with physical therapy exercises and I do think there are still some component of myofascial pain because of the compounding factor of the torticollis. No medication changes today would try to get approval for facet joint block at the right C2-3 area.    Meds & Orders: No orders of the defined types were placed in this encounter.  No orders of the defined types were placed in this encounter.   Follow-up: Return for Right C2-3 facet joint block diagnostically and hopefully therapeutically.   Procedures: No procedures performed  No notes on file   Clinical History: MRI CERVICAL SPINE WITHOUT CONTRAST  TECHNIQUE: Multiplanar, multisequence MR imaging of the cervical spine was performed. No intravenous contrast was administered.  COMPARISON:  Cervical spine radiograph 06/18/2016  FINDINGS: Alignment: Normal  Vertebrae: No fracture, evidence of discitis, or bone lesion.  Cord: Normal signal and morphology.  Posterior Fossa, vertebral arteries, paraspinal tissues: Negative.  Disc levels:  C1-C2: Normal.  C2-C3: Right-greater-than-left uncovertebral hypertrophy with mild right  foraminal stenosis. Severe right facet hypertrophy with associated edema. No spinal canal stenosis.  C3-C4: Left-sided uncovertebral hypertrophy and moderate facet hypertrophy. Moderate left foraminal stenosis. No spinal canal stenosis.  C4-C5: Bilateral uncovertebral hypertrophy. No spinal canal stenosis. No neural foraminal stenosis.  C5-C6: Small disc osteophyte complex with right-greater-than-left facet hypertrophy. Severe right neural foraminal stenosis. No central spinal canal or left neural foraminal stenosis.  C6-C7: Medium-sized disc osteophyte complex with mild narrowing of the spinal canal. Moderate bilateral neural foraminal stenosis.  C7-T1: Normal disc space and facets. No spinal canal or neuroforaminal stenosis.  T1-T2: No stenosis or disc herniation.  T2-T3: Left subarticular disc protrusion narrows the ventral thecal sac. No spinal canal or neural foraminal stenosis.  IMPRESSION: 1. Multilevel right-greater-than-left facet arthrosis, which may be a source of local neck pain. This is greatest at right C2-C3 where there is moderate to severe edema. 2. 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 3. Moderate left neural foraminal stenosis at C3-C4 due to combination of uncovertebral and facet hypertrophy. 4. Mild spinal canal stenosis at C6-C7 with moderate bilateral neural foraminal stenosis.   Electronically Signed   By: Ulyses Jarred M.D.   On: 08/29/2016 20:27  He reports that he has never smoked. He has never used smokeless tobacco. No results for input(s): HGBA1C, LABURIC in the last 8760 hours.  Objective:  VS:  HT:    WT:   BMI:     BP:121/77  HR:70bpm  TEMP: ( )  RESP:  Physical Exam  Constitutional: He is oriented to person, place, and time. He appears well-developed and well-nourished. No distress.  HENT:  Head: Normocephalic and atraumatic.  Eyes: Pupils are  equal, round, and reactive to light. Conjunctivae are normal.  Neck: No thyromegaly present.  Cardiovascular: Regular rhythm and intact distal pulses.   Pulmonary/Chest: Effort normal. No respiratory distress.  Musculoskeletal:  Patient sits with a rightward cervical lane has difficulty turning to the right. He has pain with right rotation. He does have some active trigger points in the levator scapula and paraspinal region. He has no shoulder impingement signs. He has good upper extremity strength bilaterally. He has a negative Hoffmann's test bilaterally.  Lymphadenopathy:    He has no cervical adenopathy.  Neurological: He is alert and oriented to person, place, and time.  Skin: Skin is warm and dry. No rash noted. No erythema.  Psychiatric: He has a normal mood and affect.  Nursing note and vitals reviewed.   Ortho Exam Imaging: No results found.  Past Medical/Family/Surgical/Social History: Medications & Allergies reviewed per EMR Patient Active Problem List   Diagnosis Date Noted  . Cervicalgia 09/07/2016  . Cervical spondylosis without myelopathy 09/07/2016  . Myofascitis 09/07/2016  . Torticollis 09/07/2016   Past Medical History:  Diagnosis Date  . Arthritis   . High cholesterol   . Reflux    Family History  Problem Relation Age of Onset  . Lung cancer Father    Past Surgical History:  Procedure Laterality Date  . HEMORRHOID SURGERY    . ROTATOR CUFF REPAIR    . SHOULDER SURGERY     Social History   Occupational History  . Not on file.   Social History Main Topics  . Smoking status: Never Smoker  . Smokeless  tobacco: Never Used  . Alcohol use No  . Drug use: No  . Sexual activity: Not on file

## 2016-09-09 NOTE — Telephone Encounter (Signed)
Faxed auth form with notes to [facility] 

## 2016-09-10 ENCOUNTER — Other Ambulatory Visit (INDEPENDENT_AMBULATORY_CARE_PROVIDER_SITE_OTHER): Payer: Self-pay

## 2016-09-10 ENCOUNTER — Other Ambulatory Visit (INDEPENDENT_AMBULATORY_CARE_PROVIDER_SITE_OTHER): Payer: Self-pay | Admitting: Physical Medicine and Rehabilitation

## 2016-09-10 NOTE — Telephone Encounter (Signed)
Faxed rx to [facility] [phone]

## 2016-09-10 NOTE — Telephone Encounter (Signed)
Please advise 

## 2016-09-14 ENCOUNTER — Encounter (INDEPENDENT_AMBULATORY_CARE_PROVIDER_SITE_OTHER): Payer: Medicare HMO | Admitting: Physical Medicine and Rehabilitation

## 2016-09-14 DIAGNOSIS — R972 Elevated prostate specific antigen [PSA]: Secondary | ICD-10-CM | POA: Diagnosis not present

## 2016-09-14 DIAGNOSIS — N528 Other male erectile dysfunction: Secondary | ICD-10-CM | POA: Diagnosis not present

## 2016-09-14 DIAGNOSIS — N401 Enlarged prostate with lower urinary tract symptoms: Secondary | ICD-10-CM | POA: Diagnosis not present

## 2016-09-16 ENCOUNTER — Ambulatory Visit (INDEPENDENT_AMBULATORY_CARE_PROVIDER_SITE_OTHER): Payer: Medicare HMO

## 2016-09-16 ENCOUNTER — Encounter (INDEPENDENT_AMBULATORY_CARE_PROVIDER_SITE_OTHER): Payer: Self-pay | Admitting: Physical Medicine and Rehabilitation

## 2016-09-16 ENCOUNTER — Ambulatory Visit (INDEPENDENT_AMBULATORY_CARE_PROVIDER_SITE_OTHER): Payer: Medicare HMO | Admitting: Physical Medicine and Rehabilitation

## 2016-09-16 VITALS — BP 131/71 | HR 72 | Temp 98.0°F

## 2016-09-16 DIAGNOSIS — M542 Cervicalgia: Secondary | ICD-10-CM | POA: Diagnosis not present

## 2016-09-16 DIAGNOSIS — M47812 Spondylosis without myelopathy or radiculopathy, cervical region: Secondary | ICD-10-CM

## 2016-09-16 MED ORDER — LIDOCAINE HCL (PF) 1 % IJ SOLN
2.0000 mL | Freq: Once | INTRAMUSCULAR | Status: AC
  Administered 2016-09-16: 2 mL

## 2016-09-16 MED ORDER — METHYLPREDNISOLONE ACETATE 80 MG/ML IJ SUSP
80.0000 mg | Freq: Once | INTRAMUSCULAR | Status: AC
  Administered 2016-09-16: 80 mg

## 2016-09-16 NOTE — Patient Instructions (Signed)

## 2016-09-16 NOTE — Progress Notes (Deleted)
Patient is here today for planned right C2-3 facet injection. No change in symptoms.  States he is having a lot of lower back pain. Across back. Right=left. Down back of legs to knees. Mostly hips and lower back. No groin pain.

## 2016-09-16 NOTE — Progress Notes (Unsigned)
Fluoro Time: 23 sec Mgy: 8.59

## 2016-09-18 NOTE — Telephone Encounter (Signed)
HTVG#[id number] for code [phone]. eff 09/09/16-10/24/16.

## 2016-09-18 NOTE — Procedures (Signed)
Mr. Juan Kennedy is a 76 year old gentleman who comes in today with continued right axial neck pain. We've recently seen him for evaluation and management of this and he can see our prior notes for justification. We are going to complete a diagnostic and therapeutic right C2-3 facet joint block. He also reports today some worsening low back and hip pain. We'll evaluate further for this in the future.  Cervical Facet Joint Intra-Articular Injection with Fluoroscopic Guidance  Patient: Juan Kennedy      Date of Birth: [birth year] MRN: [id number] PCP: Velna Hatchet, MD      Visit Date: 09/16/2016   Universal Protocol:    Date/Time: 08/17/186:17 AM  Consent Given By: the patient  Position: lateral  Additional Comments: Vital signs were monitored before and after the procedure. Patient was prepped and draped in the usual sterile fashion. The correct patient, procedure, and site was verified.   Injection Procedure Details:  Procedure Site One Meds Administered:  Meds ordered this encounter  Medications  . lidocaine (PF) (XYLOCAINE) 1 % injection 2 mL  . methylPREDNISolone acetate (DEPO-MEDROL) injection 80 mg     Laterality: Right  Location/Site:  C2-3  Needle size: 25 G  Needle type: 1.5 inch  Needle Placement: Articular  Findings:  -Contrast Used: 0.5 mL iohexol 180 mg iodine/mL   -Comments: Excellent flow of contrast producing a partial arthrogram.  Procedure Details: The fluoroscope beam was manipulated to achieve the best "true" lateral view possible by squaring off the endplates with cranial and caudal tilt and using varying obliquity to achieve the a view with the longest length of spinous process.   The region overlying the facet joints mentioned above were then localized under fluoroscopic visualization. For each target described below the skin was anesthetized with 1 ml of 1% Lidocaine without epinephrine. The needle was inserted down to the level of the  lateral mass of the superior articular process of the  facet joint to be injected. Then, the needle was "walked off" inferiorly into the lateral aspect of the facet joint. Bi-planar images were used for confirming placement and spot radiographs were documented. Radiographs were obtained of the arthrogram. A 0.5 ml. volume of the steroid/anesthetic solution was injected into the joint. This procedure was repeated for each facet joint injected.   Additional Comments:  The patient tolerated the procedure well Dressing: Band-Aid    Post-procedure details: Patient was observed during the procedure. Post-procedure instructions were reviewed.  Patient left the clinic in stable condition.

## 2016-09-30 ENCOUNTER — Telehealth (INDEPENDENT_AMBULATORY_CARE_PROVIDER_SITE_OTHER): Payer: Self-pay | Admitting: Physical Medicine and Rehabilitation

## 2016-09-30 NOTE — Telephone Encounter (Signed)
Ov/re-eval back with possible facet blocks - has HTA so may need pre-auth

## 2016-09-30 NOTE — Telephone Encounter (Signed)
Patient needs approval for bilateral [phone], [id number]. He is scheduled for 10/15/16 at 130 for ov/ possible injection.

## 2016-10-01 NOTE — Telephone Encounter (Signed)
Faxed auth request form and notes from 2017 for l-spine to [facility]

## 2016-10-14 NOTE — Telephone Encounter (Signed)
Received auth for [phone] and [phone].Auth#[id number]. eff 10/01/16-11/15/16.

## 2016-10-15 ENCOUNTER — Ambulatory Visit (INDEPENDENT_AMBULATORY_CARE_PROVIDER_SITE_OTHER): Payer: Self-pay

## 2016-10-15 ENCOUNTER — Ambulatory Visit (INDEPENDENT_AMBULATORY_CARE_PROVIDER_SITE_OTHER): Payer: Medicare HMO | Admitting: Physical Medicine and Rehabilitation

## 2016-10-15 ENCOUNTER — Encounter (INDEPENDENT_AMBULATORY_CARE_PROVIDER_SITE_OTHER): Payer: Self-pay | Admitting: Physical Medicine and Rehabilitation

## 2016-10-15 VITALS — BP 131/81 | HR 90

## 2016-10-15 DIAGNOSIS — M47816 Spondylosis without myelopathy or radiculopathy, lumbar region: Secondary | ICD-10-CM | POA: Diagnosis not present

## 2016-10-15 DIAGNOSIS — G8929 Other chronic pain: Secondary | ICD-10-CM | POA: Diagnosis not present

## 2016-10-15 DIAGNOSIS — M545 Low back pain: Secondary | ICD-10-CM | POA: Diagnosis not present

## 2016-10-15 MED ORDER — LIDOCAINE HCL (PF) 1 % IJ SOLN
2.0000 mL | Freq: Once | INTRAMUSCULAR | Status: AC
  Administered 2016-10-15: 2 mL

## 2016-10-15 MED ORDER — METHYLPREDNISOLONE ACETATE 80 MG/ML IJ SUSP
80.0000 mg | Freq: Once | INTRAMUSCULAR | Status: AC
  Administered 2016-10-15: 80 mg

## 2016-10-15 NOTE — Patient Instructions (Signed)

## 2016-10-15 NOTE — Progress Notes (Signed)
Juan Kennedy - 76 y.o. male MRN [id number]  Date of birth: 02-11-1940  Office Visit Note: Visit Date: 10/15/2016 PCP: Velna Hatchet, MD Referred by: Velna Hatchet, MD  Subjective: Chief Complaint  Patient presents with  . Lower Back - Pain   HPI: Juan Kennedy is a 76 year old right-hand dominant gentleman who we recently saw for right-sided cervicalgia. As of note we completed facet joint blocks on the right with not much relief of the mid facet joints. He has gotten now relief with injection at the C2-3 level. He reports still having pain and a focal area pointing to about the upper to mid cervical region on the right. The radiating symptoms have become much better at this point. His case is complicated by poor: [place]. He does receive Botox injections followed by neurology. He has not really been doing any recent exercises and is been to extensive physical therapy at [facility]. He again has no pain radiating down the arms no radicular pain and no paresthesias. No associated headaches per se.  His secondary issue which is actually more problematic at this point is his lower back pain. He's having pain across the lower back really at the lumbosacral junction or a little bit above with referral pain below. His right side is much worse than left. Traditionally his left had been much worse. By way of review he had had significant facet joint cyst probably impacting the right side at L4-5. This causes some stenosis of the lateral recesses and multifactorial moderate stenosis at L3-4. This is reviewed again below. Facet joint injection and aspiration of helped him quite a bit and we really haven't seen for a long time in terms of his low back. This is flared up recently without any new injuries or trauma. Again no radicular pain. He does get some pain down the back of leg to the knee bilaterally again right worse than left. No paresthesia. It's worse with getting up from a  chair agrees with sitting for a while. It is increased with walking and standing. He does not know really any feeling of heavy legs are weak legs. He does feel like he gets into positions were does want to drop or give way. He's had no bowel or bladder symptoms. He's had no fever chills or night sweats. He can last MRI was 2014.    Review of Systems  Constitutional: Negative for chills, fever, malaise/fatigue and weight loss.  HENT: Negative for hearing loss and sinus pain.   Eyes: Negative for blurred vision, double vision and photophobia.  Respiratory: Negative for cough and shortness of breath.   Cardiovascular: Negative for chest pain, palpitations and leg swelling.  Gastrointestinal: Negative for abdominal pain, nausea and vomiting.  Genitourinary: Negative for flank pain.  Musculoskeletal: Positive for back pain, joint pain and neck pain. Negative for myalgias.  Skin: Negative for itching and rash.  Neurological: Negative for tremors, focal weakness and weakness.  Endo/Heme/Allergies: Negative.   Psychiatric/Behavioral: Negative for depression.  All other systems reviewed and are negative.  Otherwise per HPI.  Assessment & Plan: Visit Diagnoses:  1. Spondylosis without myelopathy or radiculopathy, lumbar region   2. Chronic bilateral low back pain without sciatica     Plan: Findings:  His worst symptoms right now the low back pain which is worsened over the last several months to weeks. This is really more right than left-sided but is bilateral. Is going down behind the legs and worse with going from sit  to stand. I still feel like this is facet mediated pain. 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 I think today the best plan would be to repeat facet joint injection aspiration but do this bilaterally. If he doesn't get much relief with that I would look at a one-time epidural injection at L3-4. Conversely  depending on where his this head CT probably needs an updated MRI of the pain level is getting much worse just because of his been almost 5 years. It would be nice to see if the facet joint cyst had regressed or changed. Otherwise he'll continue with his current anti-inflammatory medication management.  In terms of his neck pain is really problematic facet joint block is diminished his pain quite a bit but he still has a focal area of pain. He's had extensive physical therapy but has not really been doing any of the exercises. I did request that he started doing some exercises back trying to get range of motion. I did talk to my specific exercises to get range of motion. We've talked about potentially using supplemental microvolt Aron gel in the future to try to see if any, local anti-inflammatory would help. Again his case,. By torticollis which I do think contributes to the pain to a degree.    Meds & Orders:  Meds ordered this encounter  Medications  . lidocaine (PF) (XYLOCAINE) 1 % injection 2 mL  . methylPREDNISolone acetate (DEPO-MEDROL) injection 80 mg    Orders Placed This Encounter  Procedures  . Facet Injection  . XR C-ARM NO REPORT    Follow-up: Return if symptoms worsen or fail to improve.   Procedures: No procedures performed  Lumbar Facet Joint Intra-Articular Injection(s) with Fluoroscopic Guidance  Patient: Juan Kennedy      Date of Birth: [birth year] MRN: [id number] PCP: Velna Hatchet, MD      Visit Date: 10/15/2016   Universal Protocol:    Date/Time: 10/15/2016  Consent Given By: the patient  Position: PRONE   Additional Comments: Vital signs were monitored before and after the procedure. Patient was prepped and draped in the usual sterile fashion. The correct patient, procedure, and site was verified.   Injection Procedure Details:  Procedure Site One Meds Administered:  Meds ordered this encounter  Medications  . lidocaine (PF) (XYLOCAINE) 1 %  injection 2 mL  . methylPREDNISolone acetate (DEPO-MEDROL) injection 80 mg     Laterality: Bilateral  Location/Site:  L4-L5  Needle size: 22 guage  Needle type: Spinal  Needle Placement: Articular  Findings:  -Contrast Used: 0.5 mL iohexol 180 mg iodine/mL   -Comments: Excellent flow of contrast producing a partial arthrogram.  Procedure Details: The fluoroscope beam is vertically oriented in AP, and the inferior recess is visualized beneath the lower pole of the inferior apophyseal process, which represents the target point for needle insertion. When direct visualization is difficult the target point is located at the medial projection of the vertebral pedicle. The region overlying each aforementioned target is locally anesthetized with a 1 to 2 ml. volume of 1% Lidocaine without Epinephrine.   The spinal needle was inserted into each of the above mentioned facet joints using biplanar fluoroscopic guidance. A 0.25 to 0.5 ml. volume of Isovue-250 was injected and a partial facet joint arthrogram was obtained. A single spot film was obtained of the resulting arthrogram.    One to 1.25 ml of the steroid/anesthetic solution  was then injected into each of the facet joints noted above.   Additional Comments:  The patient tolerated the procedure well Dressing: Band-Aid    Post-procedure details: Patient was observed during the procedure. Post-procedure instructions were reviewed.  Patient left the clinic in stable condition.     Clinical History: Lumbar Spine MRI 11/05/2012   IMPRESSION: The dominant right-sided abnormality is at L4-5 where a 6 x 8 mm synovial cyst projects into the lateral recess and foramen. Bilateral facet arthropathy is present with facet joint effusions. Right L5 and possibly right L4 nerve root impingement are noted.  Moderate central canal stenosis at L3-4 is multifactorial, secondary to central disc protrusion and posterior element  hypertrophy. Bilateral L3 and L4 nerve root impingement are possible.  MRI CERVICAL SPINE WITHOUT CONTRAST 08/29/2016  IMPRESSION: 1. Multilevel right-greater-than-left facet arthrosis, which may be a source of local neck pain. This is greatest at right C2-C3 where there is moderate to severe edema. 2. 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 3. Moderate left neural foraminal stenosis at C3-C4 due to combination of uncovertebral and facet hypertrophy. 4. Mild spinal canal stenosis at C6-C7 with moderate bilateral neural foraminal stenosis.  He reports that he has never smoked. He has never used smokeless tobacco. No results for input(s): HGBA1C, LABURIC in the last 8760 hours.  Objective:  VS:  HT:    WT:   BMI:     BP:131/81  HR:90bpm  TEMP: ( )  RESP:98 % Physical Exam  Constitutional: He is oriented to person, place, and time. He appears well-developed and well-nourished. No distress.  HENT:  Head: Normocephalic and atraumatic.  Nose: Nose normal.  Mouth/Throat: Oropharynx is clear and moist.  Eyes: Pupils are equal, round, and reactive to light. Conjunctivae are normal.  Neck: No tracheal deviation present.  Patient has rightward torticollis. He is very tight paraspinal musculature and trapezius and scalene musculature. He does have pain with rotation. He has limited range of motion more limited to the right than left. He has some shoulder impingement more right than left. He has good upper body strength bilaterally without any deficits side to side. There is a negative Hoffmann's test.  Cardiovascular: Regular rhythm and intact distal pulses.   Pulmonary/Chest: Effort normal and breath sounds normal.  Abdominal: Soft. He exhibits no distension.  Musculoskeletal: He exhibits no deformity.  Patient ambulates without aid. He is slow to rise from a seated position. He does a lot of pain which is  concordant low back pain with extension rotation of the lumbar spine particularly to the right. He has no pain over the greater trochanters. He has no pain with hip rotation although he does have some stiffness in the hips. He has good distal strength without clonus.  Lymphadenopathy:    He has no cervical adenopathy.  Neurological: He is alert and oriented to person, place, and time.  Skin: Skin is warm. No rash noted.  Psychiatric: He has a normal mood and affect. His behavior is normal.  Nursing note and vitals reviewed.   Ortho Exam Imaging: No results found.  Past Medical/Family/Surgical/Social History: Medications & Allergies reviewed per EMR Patient Active Problem List   Diagnosis Date Noted  . Cervicalgia 09/07/2016  . Cervical spondylosis without myelopathy 09/07/2016  . Myofascitis 09/07/2016  . Torticollis 09/07/2016   Past Medical History:  Diagnosis Date  . Arthritis   . High cholesterol   . Reflux  Family History  Problem Relation Age of Onset  . Lung cancer Father    Past Surgical History:  Procedure Laterality Date  . HEMORRHOID SURGERY    . ROTATOR CUFF REPAIR    . SHOULDER SURGERY     Social History   Occupational History  . Not on file.   Social History Main Topics  . Smoking status: Never Smoker  . Smokeless tobacco: Never Used  . Alcohol use No  . Drug use: No  . Sexual activity: Not on file

## 2016-10-15 NOTE — Progress Notes (Deleted)
Pain across lower back and down the back of the legs to knee. Right side is worse. Worse when getting up from a chair and with increased walking and standing.

## 2016-10-19 ENCOUNTER — Encounter (INDEPENDENT_AMBULATORY_CARE_PROVIDER_SITE_OTHER): Payer: Self-pay | Admitting: Physical Medicine and Rehabilitation

## 2016-10-19 NOTE — Procedures (Signed)
Lumbar Facet Joint Intra-Articular Injection(s) with Fluoroscopic Guidance  Patient: Juan Kennedy      Date of Birth: [birth year] MRN: [id number] PCP: Velna Hatchet, MD      Visit Date: 10/15/2016   Universal Protocol:    Date/Time: 10/15/2016  Consent Given By: the patient  Position: PRONE   Additional Comments: Vital signs were monitored before and after the procedure. Patient was prepped and draped in the usual sterile fashion. The correct patient, procedure, and site was verified.   Injection Procedure Details:  Procedure Site One Meds Administered:  Meds ordered this encounter  Medications  . lidocaine (PF) (XYLOCAINE) 1 % injection 2 mL  . methylPREDNISolone acetate (DEPO-MEDROL) injection 80 mg     Laterality: Bilateral  Location/Site:  L4-L5  Needle size: 22 guage  Needle type: Spinal  Needle Placement: Articular  Findings:  -Contrast Used: 0.5 mL iohexol 180 mg iodine/mL   -Comments: Excellent flow of contrast producing a partial arthrogram.  Procedure Details: The fluoroscope beam is vertically oriented in AP, and the inferior recess is visualized beneath the lower pole of the inferior apophyseal process, which represents the target point for needle insertion. When direct visualization is difficult the target point is located at the medial projection of the vertebral pedicle. The region overlying each aforementioned target is locally anesthetized with a 1 to 2 ml. volume of 1% Lidocaine without Epinephrine.   The spinal needle was inserted into each of the above mentioned facet joints using biplanar fluoroscopic guidance. A 0.25 to 0.5 ml. volume of Isovue-250 was injected and a partial facet joint arthrogram was obtained. A single spot film was obtained of the resulting arthrogram.    One to 1.25 ml of the steroid/anesthetic solution was then injected into each of the facet joints noted above.   Additional Comments:  The patient tolerated the  procedure well Dressing: Band-Aid    Post-procedure details: Patient was observed during the procedure. Post-procedure instructions were reviewed.  Patient left the clinic in stable condition.

## 2016-11-17 ENCOUNTER — Telehealth (INDEPENDENT_AMBULATORY_CARE_PROVIDER_SITE_OTHER): Payer: Self-pay | Admitting: Physical Medicine and Rehabilitation

## 2016-11-17 NOTE — Telephone Encounter (Signed)
Patient needs [facility] for bilateral [phone]- not scheduled yet.

## 2016-11-17 NOTE — Telephone Encounter (Signed)
Bilateral L3 TF esi versus update lspine MRI

## 2016-11-18 NOTE — Telephone Encounter (Signed)
Faxed auth form and last office note to [employer]

## 2016-11-23 NOTE — Telephone Encounter (Signed)
Received auth for [phone]. Auth#[id number]. Eff 11/18/16-01/02/17.

## 2016-12-02 ENCOUNTER — Ambulatory Visit (INDEPENDENT_AMBULATORY_CARE_PROVIDER_SITE_OTHER): Payer: Medicare HMO | Admitting: Physical Medicine and Rehabilitation

## 2016-12-02 ENCOUNTER — Encounter (INDEPENDENT_AMBULATORY_CARE_PROVIDER_SITE_OTHER): Payer: Self-pay | Admitting: Physical Medicine and Rehabilitation

## 2016-12-02 ENCOUNTER — Ambulatory Visit (INDEPENDENT_AMBULATORY_CARE_PROVIDER_SITE_OTHER): Payer: Medicare HMO

## 2016-12-02 VITALS — BP 131/76 | HR 65 | Temp 98.7°F | Resp 97

## 2016-12-02 DIAGNOSIS — M48061 Spinal stenosis, lumbar region without neurogenic claudication: Secondary | ICD-10-CM

## 2016-12-02 DIAGNOSIS — M5416 Radiculopathy, lumbar region: Secondary | ICD-10-CM

## 2016-12-02 MED ORDER — BETAMETHASONE SOD PHOS & ACET 6 (3-3) MG/ML IJ SUSP
12.0000 mg | Freq: Once | INTRAMUSCULAR | Status: AC
  Administered 2016-12-02: 12 mg

## 2016-12-02 MED ORDER — LIDOCAINE HCL (PF) 1 % IJ SOLN
2.0000 mL | Freq: Once | INTRAMUSCULAR | Status: AC
  Administered 2016-12-02: 2 mL

## 2016-12-02 NOTE — Patient Instructions (Signed)

## 2016-12-02 NOTE — Progress Notes (Deleted)
Pt here for low back injection radiates to the knees bilateral, hurts knees when goes up stairs. Pt is having some hip pain on both sides. Pt states right side is the worse. is not taking any blood thinners, no allergy to contrast dye.

## 2016-12-07 ENCOUNTER — Encounter (INDEPENDENT_AMBULATORY_CARE_PROVIDER_SITE_OTHER): Payer: Self-pay | Admitting: Physical Medicine and Rehabilitation

## 2016-12-09 ENCOUNTER — Encounter (INDEPENDENT_AMBULATORY_CARE_PROVIDER_SITE_OTHER): Payer: Self-pay | Admitting: Physical Medicine and Rehabilitation

## 2016-12-09 ENCOUNTER — Ambulatory Visit (INDEPENDENT_AMBULATORY_CARE_PROVIDER_SITE_OTHER): Payer: Medicare HMO | Admitting: Physical Medicine and Rehabilitation

## 2016-12-09 VITALS — BP 128/79 | HR 83

## 2016-12-09 DIAGNOSIS — M609 Myositis, unspecified: Secondary | ICD-10-CM | POA: Diagnosis not present

## 2016-12-09 DIAGNOSIS — G8929 Other chronic pain: Secondary | ICD-10-CM

## 2016-12-09 DIAGNOSIS — M542 Cervicalgia: Secondary | ICD-10-CM | POA: Diagnosis not present

## 2016-12-09 DIAGNOSIS — M47812 Spondylosis without myelopathy or radiculopathy, cervical region: Secondary | ICD-10-CM | POA: Diagnosis not present

## 2016-12-09 DIAGNOSIS — M5441 Lumbago with sciatica, right side: Secondary | ICD-10-CM

## 2016-12-09 DIAGNOSIS — M436 Torticollis: Secondary | ICD-10-CM | POA: Diagnosis not present

## 2016-12-09 DIAGNOSIS — M48061 Spinal stenosis, lumbar region without neurogenic claudication: Secondary | ICD-10-CM | POA: Diagnosis not present

## 2016-12-09 NOTE — Progress Notes (Deleted)
Neck pain Right sided. Does not radiate. Some tingling in right arm sometimes. Feels like there are a few trigger points that are causing pain.

## 2016-12-10 NOTE — Procedures (Signed)
Juan Kennedy is a 76 year old gentleman that I have known quite well now over the last few years.  He has chronic neck and low back pain.  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  Most recent injection was bilateral facet joint blocks of the lower spine that really did not offer much relief.  He has had updated MRI of the lower spine showing stenosis at L3-4 and L4-5.  His symptoms are more consistent with L4 stenosis.  Diagnostically with a complete bilateral L4 transforaminal injections.  Please see our prior notes for justification.  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  In terms of his neck pain facet joint blocks were not beneficial.  I want to see him back in a couple weeks for further evaluation of his neck.  We may look at trigger point injections at that time.  Lumbosacral Transforaminal Epidural Steroid Injection - Sub-Pedicular Approach with Fluoroscopic Guidance  Patient: Juan Kennedy      Date of Birth: 1940-12-17 MRN: [id number] PCP: Velna Hatchet, MD      Visit Date: 12/02/2016   Universal Protocol:    Date/Time: 12/02/2016  Consent Given By: the patient  Position: PRONE  Additional Comments: Vital signs were monitored before and after the procedure. Patient was prepped and draped in the usual sterile fashion. The correct patient, procedure, and site was verified.   Injection Procedure Details:  Procedure Site One Meds Administered:  Meds ordered this encounter  Medications  . betamethasone acetate-betamethasone sodium phosphate (CELESTONE) injection 12 mg  . lidocaine (PF) (XYLOCAINE) 1 % injection 2 mL    Laterality: Bilateral  Location/Site:  L4-L5  Needle size: 22 G  Needle type: Spinal  Needle Placement: Transforaminal  Findings:  -Contrast Used: 1 mL iohexol 180 mg iodine/mL   -Comments: Excellent flow of contrast along the nerve and into the epidural space.  Procedure Details: After squaring off the end-plates to get a true AP view, the  C-arm was positioned so that an oblique view of the foramen as noted above was visualized. The target area is just inferior to the "nose of the scotty dog" or sub pedicular. The soft tissues overlying this structure were infiltrated with 2-3 ml. of 1% Lidocaine without Epinephrine.  The spinal needle was inserted toward the target using a "trajectory" view along the fluoroscope beam.  Under AP and lateral visualization, the needle was advanced so it did not puncture dura and was located close the 6 O'Clock position of the pedical in AP tracterory. Biplanar projections were used to confirm position. Aspiration was confirmed to be negative for CSF and/or blood. A 1-2 ml. volume of Isovue-250 was injected and flow of contrast was noted at each level. Radiographs were obtained for documentation purposes.   After attaining the desired flow of contrast documented above, a 0.5 to 1.0 ml test dose of 0.25% Marcaine was injected into each respective transforaminal space.  The patient was observed for 90 seconds post injection.  After no sensory deficits were reported, and normal lower extremity motor function was noted,   the above injectate was administered so that equal amounts of the injectate were placed at each foramen (level) into the transforaminal epidural space.   Additional Comments:  The patient tolerated the procedure well Dressing: Band-Aid    Post-procedure details: Patient was observed during the procedure. Post-procedure instructions were reviewed.  Patient left the clinic in stable condition.

## 2016-12-15 ENCOUNTER — Encounter (INDEPENDENT_AMBULATORY_CARE_PROVIDER_SITE_OTHER): Payer: Self-pay | Admitting: Physical Medicine and Rehabilitation

## 2016-12-15 NOTE — Progress Notes (Signed)
Juan Kennedy - 76 y.o. male MRN [id number]  Date of birth: 06-06-1940  Office Visit Note: Visit Date: 12/09/2016 PCP: Velna Hatchet, MD Referred by: Velna Hatchet, MD  Subjective: Chief Complaint  Patient presents with  . Neck - Pain  . Right Shoulder - Pain  . Lower Back - Pain  . Right Hip - Pain   HPI: Juan Kennedy is a 76 year old gentleman that I know quite well over the last several years.  He comes in today with 2 distinct problems.  He continues to have right-sided neck pain worse fairly constantly but worse with movement and worse in the morning.  He has significant cervical facet joint arthropathy cervical facet joint injections offered really very little relief.  He has had dry needling and extensive physical therapy.  Initially that seemed to help but then at some point really sort of flared things up.  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  He has no referral pattern down the arms.  He has no numbness tingling or paresthesia.  He has tried medication management including anti-inflammatories as well as tramadol muscle relaxers including clonazepam, temazepam and tizanidine.  We have gone over in the past activity modification positions that may be advantageous to rest his neck at times.  He tells me that he really fails to do some of those most of the time.  We have gone over strengthening before the cervical spine but he tells me not really doing those exercises.  He did go over those again today and I did give him a printed out sheet to look at.  Shoulder pain he does report that a very specific focal area of pain on the right side at about the area where the scalene musculature soleus musculature attaches.  Secondary issue is his low back pain.  He has significant lumbar spondylosis with facet arthropathy.  He has facet joint cyst and he also has 2 level stenosis.  His stenosis is fairly  significant.  Last injection performed was a epidural injection that gave him quite a bit of relief.  Overall his back is doing well other than the right-sided low back and hip pain.  He reports being uncomfortable with standing and ambulating.  He is better at rest.  He has had no new issues with his lower back except the right hip continues to be a bothersome factor.  He has no groin pain.  No specific injury.  No weakness.    Review of Systems  Constitutional: Negative for chills, fever, malaise/fatigue and weight loss.  HENT: Negative for hearing loss and sinus pain.   Eyes: Negative for blurred vision, double vision and photophobia.  Respiratory: Negative for cough and shortness of breath.   Cardiovascular: Negative for chest pain, palpitations and leg swelling.  Gastrointestinal: Negative for abdominal pain, nausea and vomiting.  Genitourinary: Negative for flank pain.  Musculoskeletal: Positive for back pain, joint pain and neck pain. Negative for myalgias.  Skin: Negative for itching and rash.  Neurological: Negative for tremors, focal weakness and weakness.  Endo/Heme/Allergies: Negative.   Psychiatric/Behavioral: Negative for depression.  All other systems reviewed and are negative.  Otherwise per HPI.  Assessment & Plan: Visit Diagnoses:  1. Cervicalgia   2. Cervical spondylosis without myelopathy   3. Myofascitis   4. Torticollis   5. Spinal stenosis of lumbar region without neurogenic claudication   6. Chronic right-sided low back pain with right-sided  sciatica     Plan: Findings:  1.  Cervicalgia and myofascial pain complicated by torticollis and increased spasm.  His neck pain is really multifactorial.  Clearly has facet arthropathy as well.  Facet joint blocks were really ineffective.  Physical therapy has worked for him in the past but more recently it seemed to flareup his symptoms.  We are going to complete trigger point injections today and those are dictated below.   We will see how he does from a diagnostic standpoint with that.  If it does not do as well as we likely will not look at cervical epidural injection one time.  We have also discussed at length today we probably spent more than 25 minutes just on exercises and activity modification for his neck and positional changes.  He might do well with regrouping with physical therapy but avoiding aggressive dry needling.  Another avenue medication wise might be to look at a selective norepinephrine type medication.  2.  For his low back pain at this point I think status quo is the best.  He is getting along much better than when I saw him the other day.  He still has some right hip pain that is bothersome and really seems to be getting him down.  He has a hard time standing for long length of time.  Overall he is better.  He does have significant stenosis he may do well with referral to spine surgery or neurosurgery is from evaluation standpoint.  He does not have much in the way of listhesis he might do well with decompression.    Meds & Orders: No orders of the defined types were placed in this encounter.   Orders Placed This Encounter  Procedures  . Trigger Point Inj    Follow-up: Return if symptoms worsen or fail to improve.   Procedures: Trigger Point Inj Date/Time: 12/15/2016 5:22 AM Performed by: Magnus Sinning, MD Authorized by: Magnus Sinning, MD   Consent Given by:  Patient Site marked: the procedure site was marked   Timeout: prior to procedure the correct patient, procedure, and site was verified   Indications:  Pain Total # of Trigger Points:  2 Location: neck and back   Needle Size:  25 G Approach:  Dorsal Patient tolerance:  Patient tolerated the procedure well with no immediate complications Comments: Trigger points were palpated in the right scalene paraspinal musculature.  A needling technique was utilized.    No notes on file   Clinical History: Lumbar Spine MRI  11/05/2012   IMPRESSION: The dominant right-sided abnormality is at L4-5 where a 6 x 8 mm synovial cyst projects into the lateral recess and foramen. Bilateral facet arthropathy is present with facet joint effusions. Right L5 and possibly right L4 nerve root impingement are noted.  Moderate central canal stenosis at L3-4 is multifactorial, secondary to central disc protrusion and posterior element hypertrophy. Bilateral L3 and L4 nerve root impingement are possible.  MRI CERVICAL SPINE WITHOUT CONTRAST 08/29/2016  IMPRESSION: 1. Multilevel right-greater-than-left facet arthrosis, which may be a source of local neck pain. This is greatest at right C2-C3 where there is moderate to severe edema. 2. 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 3. Moderate left neural foraminal stenosis at C3-C4 due to combination of uncovertebral and facet hypertrophy. 4. Mild spinal canal stenosis at C6-C7 with moderate bilateral neural foraminal stenosis.  He reports that  has never smoked. he has never  used smokeless tobacco. No results for input(s): HGBA1C, LABURIC in the last 8760 hours.  Objective:  VS:  HT:    WT:   BMI:     BP:128/79  HR:83bpm  TEMP: ( )  RESP:  Physical Exam  Constitutional: He is oriented to person, place, and time. He appears well-developed and well-nourished. No distress.  HENT:  Head: Normocephalic and atraumatic.  Nose: Nose normal.  Mouth/Throat: Oropharynx is clear and moist.  Eyes: Conjunctivae are normal. Pupils are equal, round, and reactive to light.  Neck: No tracheal deviation present.  Rightward rotation with spasm and torticollis  Cardiovascular: Regular rhythm and intact distal pulses.  Pulmonary/Chest: Effort normal and breath sounds normal.  Abdominal: Soft. He exhibits no distension.  Musculoskeletal: He exhibits no deformity.  Cervical exam shows increased spasm in the  right paraspinal and trapezius muscle with torticollis and right rotation.  He has difficulty rotating to the left is from a muscular standpoint.  He does sit with a significant forward flexed cervical spine.  He does have tenderness in the paraspinal region in an area where the scalene musculature attaches into the upper cervical spine on the right.  He has other trigger points noted in the supraspinatus as well as rhomboid.  He has some mild shoulder impingement on the right.  He has good upper body strength bilaterally and symmetric.  He has a negative Hoffman's test bilaterally.  Examination of the lower extremity shows pain with extension of the lower spine.  He has good distal strength and ambulates without aid.  Lymphadenopathy:    He has no cervical adenopathy.  Neurological: He is alert and oriented to person, place, and time.  Skin: Skin is warm. No rash noted.  Psychiatric: His behavior is normal.  Somewhat flat affect and seems to be somewhat depressed with his current state of pain  Nursing note and vitals reviewed.   Ortho Exam Imaging: No results found.  Past Medical/Family/Surgical/Social History: Medications & Allergies reviewed per EMR Patient Active Problem List   Diagnosis Date Noted  . Cervicalgia 09/07/2016  . Cervical spondylosis without myelopathy 09/07/2016  . Myofascitis 09/07/2016  . Torticollis 09/07/2016   Past Medical History:  Diagnosis Date  . Arthritis   . High cholesterol   . Reflux    Family History  Problem Relation Age of Onset  . Lung cancer Father    Past Surgical History:  Procedure Laterality Date  . HEMORRHOID SURGERY    . ROTATOR CUFF REPAIR    . SHOULDER SURGERY     Social History   Occupational History  . Not on file  Tobacco Use  . Smoking status: Never Smoker  . Smokeless tobacco: Never Used  Substance and Sexual Activity  . Alcohol use: No  . Drug use: No  . Sexual activity: Not on file

## 2016-12-16 ENCOUNTER — Telehealth (INDEPENDENT_AMBULATORY_CARE_PROVIDER_SITE_OTHER): Payer: Self-pay | Admitting: Physical Medicine and Rehabilitation

## 2016-12-16 ENCOUNTER — Encounter (INDEPENDENT_AMBULATORY_CARE_PROVIDER_SITE_OTHER): Payer: Self-pay | Admitting: Physical Medicine and Rehabilitation

## 2016-12-17 NOTE — Telephone Encounter (Signed)
Scheduled for 12/28/16 at 1345 with a driver.

## 2016-12-28 ENCOUNTER — Ambulatory Visit (INDEPENDENT_AMBULATORY_CARE_PROVIDER_SITE_OTHER): Payer: Medicare HMO | Admitting: Physical Medicine and Rehabilitation

## 2016-12-28 ENCOUNTER — Encounter (INDEPENDENT_AMBULATORY_CARE_PROVIDER_SITE_OTHER): Payer: Self-pay | Admitting: Physical Medicine and Rehabilitation

## 2016-12-28 ENCOUNTER — Ambulatory Visit (INDEPENDENT_AMBULATORY_CARE_PROVIDER_SITE_OTHER): Payer: Medicare HMO

## 2016-12-28 ENCOUNTER — Telehealth (INDEPENDENT_AMBULATORY_CARE_PROVIDER_SITE_OTHER): Payer: Self-pay | Admitting: Physical Medicine and Rehabilitation

## 2016-12-28 VITALS — BP 131/71 | HR 61 | Temp 97.5°F

## 2016-12-28 DIAGNOSIS — M48062 Spinal stenosis, lumbar region with neurogenic claudication: Secondary | ICD-10-CM | POA: Diagnosis not present

## 2016-12-28 DIAGNOSIS — M5416 Radiculopathy, lumbar region: Secondary | ICD-10-CM | POA: Diagnosis not present

## 2016-12-28 DIAGNOSIS — M609 Myositis, unspecified: Secondary | ICD-10-CM

## 2016-12-28 DIAGNOSIS — M542 Cervicalgia: Secondary | ICD-10-CM | POA: Diagnosis not present

## 2016-12-28 DIAGNOSIS — M5010 Cervical disc disorder with radiculopathy, unspecified cervical region: Secondary | ICD-10-CM

## 2016-12-28 DIAGNOSIS — M436 Torticollis: Secondary | ICD-10-CM | POA: Diagnosis not present

## 2016-12-28 MED ORDER — BETAMETHASONE SOD PHOS & ACET 6 (3-3) MG/ML IJ SUSP
12.0000 mg | Freq: Once | INTRAMUSCULAR | Status: AC
  Administered 2016-12-28: 12 mg

## 2016-12-28 MED ORDER — LIDOCAINE HCL (PF) 1 % IJ SOLN
2.0000 mL | Freq: Once | INTRAMUSCULAR | Status: AC
  Administered 2016-12-28: 2 mL

## 2016-12-28 NOTE — Progress Notes (Signed)
Pt here for low back injection, pt is in pain today in neck, pt is also in bilateral hips and back of legs. Pt is taking naproxan 500 mg. Right side is worse. Pt does have driver, no allergy to contrast dye.

## 2016-12-28 NOTE — Patient Instructions (Signed)

## 2016-12-29 NOTE — Telephone Encounter (Signed)
Tried to call patient to schedule and got busy signal.

## 2016-12-30 NOTE — Telephone Encounter (Signed)
Scheduled for 01/11/17 at 1445.

## 2017-01-01 ENCOUNTER — Telehealth (INDEPENDENT_AMBULATORY_CARE_PROVIDER_SITE_OTHER): Payer: Self-pay | Admitting: Physical Medicine and Rehabilitation

## 2017-01-01 ENCOUNTER — Other Ambulatory Visit (INDEPENDENT_AMBULATORY_CARE_PROVIDER_SITE_OTHER): Payer: Self-pay | Admitting: Physical Medicine and Rehabilitation

## 2017-01-01 MED ORDER — HYDROCODONE-ACETAMINOPHEN 5-325 MG PO TABS: 1.0000 | ORAL_TABLET | Freq: Three times a day (TID) | ORAL | 0 refills | Status: DC | PRN

## 2017-01-01 NOTE — Telephone Encounter (Signed)
Printed need to pick up

## 2017-01-01 NOTE — Telephone Encounter (Signed)
Patient called saying that his neck is really hurting him and he was asking for some pain medications to hold him over til his appointment with Dr. Ernestina Patches on the 10th. CB # [phone]

## 2017-01-01 NOTE — Telephone Encounter (Signed)
Please advise 

## 2017-01-01 NOTE — Telephone Encounter (Signed)
Patient notified that prescription is at our front desk for pick up.

## 2017-01-06 ENCOUNTER — Encounter (INDEPENDENT_AMBULATORY_CARE_PROVIDER_SITE_OTHER): Payer: Self-pay | Admitting: Physical Medicine and Rehabilitation

## 2017-01-06 NOTE — Progress Notes (Signed)
Juan Kennedy - 76 y.o. male MRN [id number]  Date of birth: 17-Jan-1941  Office Visit Note: Visit Date: 12/28/2016 PCP: Velna Hatchet, MD Referred by: Velna Hatchet, MD  Subjective: Chief Complaint  Patient presents with  . Lower Back - Pain  . Left Hip - Pain  . Right Hip - Pain  . Neck - Pain   HPI: Juan Kennedy is a 76 year old gentleman that came in today for planned epidural injection for significant stenosis particularly at L4-5 of the lumbar spine.  He is having low back pain with bilateral radicular pain.  He unfortunately has had a spell over the last several months where everything just seems to be flared up and hurting.  We have evaluated his lower back recently completed injection that did help for a little while to repeat the epidural injection.  He has a known history of facet joint cyst as well updating the MRI depending on he gets with the injection.  He has a secondary issue today with his right-sided neck pain continues to bother him.  We have completed facet joint blocks as well as trigger point injections without relief.  Prior dry needling and therapy helped but that seemed to flare things up but now he gets more pain on the right neck relieve in the lower back.  He is having some referral pain to the shoulder but nothing down the arm and no paresthesias.  No numbness or tingling.  He has a history of torticollis.  He has been followed in the past by neurology.  He is not received Botox injections in quite some time and that may be an issue.  He has not had any new trauma.  No other issues except just increasing severe pain.  He is very frustrated at this point.  He has had no other signs or symptoms that are concerning.  No focal weakness no bowel or bladder changes.    Review of Systems  Constitutional: Negative for chills, fever, malaise/fatigue and weight loss.  HENT: Negative for hearing loss and sinus pain.   Eyes: Negative for blurred vision, double  vision and photophobia.  Respiratory: Negative for cough and shortness of breath.   Cardiovascular: Negative for chest pain, palpitations and leg swelling.  Gastrointestinal: Negative for abdominal pain, nausea and vomiting.  Genitourinary: Negative for flank pain.  Musculoskeletal: Positive for back pain, joint pain and neck pain. Negative for myalgias.  Skin: Negative for itching and rash.  Neurological: Negative for tremors, focal weakness and weakness.  Endo/Heme/Allergies: Negative.   Psychiatric/Behavioral: Negative for depression.  All other systems reviewed and are negative.  Otherwise per HPI.  Assessment & Plan: Visit Diagnoses:  1. Spinal stenosis of lumbar region with neurogenic claudication   2. Lumbar radiculopathy   3. Cervical disc disorder with radiculopathy   4. Cervicalgia   5. Torticollis   6. Myofascitis     Plan: Findings:  For his chronic low back pain and radicular leg pain bilaterally in the hips we are going to complete a bilateral L4 transforaminal epidural steroid injection.  He has known stenosis at L3-4 and L4-5.  Relief of the injection I may update his MRI of the lumbar spine.  He will continue on current medications.  For his cervical radicular and discussed this at length.  It may be worse regrouping with a physical therapist for dry needling which is not very aggressive.  He may also benefit from seeing neurology for potential Botox injections.    Meds &  Orders:  Meds ordered this encounter  Medications  . lidocaine (PF) (XYLOCAINE) 1 % injection 2 mL  . betamethasone acetate-betamethasone sodium phosphate (CELESTONE) injection 12 mg    Orders Placed This Encounter  Procedures  . XR C-ARM NO REPORT  . Epidural Steroid injection    Follow-up: Return for Right C7-T1 interlaminar epidural.   Procedures: No procedures performed  Lumbosacral Transforaminal Epidural Steroid Injection - Sub-Pedicular Approach with Fluoroscopic  Guidance  Patient: Juan Kennedy      Date of Birth: 08/07/1940 MRN: [id number] PCP: Velna Hatchet, MD      Visit Date: 12/28/2016   Universal Protocol:    Date/Time: 12/28/2016  Consent Given By: the patient  Position: PRONE  Additional Comments: Vital signs were monitored before and after the procedure. Patient was prepped and draped in the usual sterile fashion. The correct patient, procedure, and site was verified.   Injection Procedure Details:  Procedure Site One Meds Administered:  Meds ordered this encounter  Medications  . lidocaine (PF) (XYLOCAINE) 1 % injection 2 mL  . betamethasone acetate-betamethasone sodium phosphate (CELESTONE) injection 12 mg    Laterality: Bilateral  Location/Site:  L4-L5  Needle size: 22 G  Needle type: Spinal  Needle Placement: Transforaminal  Findings:    -Comments: Excellent flow of contrast along the nerve and into the epidural space.  Procedure Details: After squaring off the end-plates to get a true AP view, the C-arm was positioned so that an oblique view of the foramen as noted above was visualized. The target area is just inferior to the "nose of the scotty dog" or sub pedicular. The soft tissues overlying this structure were infiltrated with 2-3 ml. of 1% Lidocaine without Epinephrine.  The spinal needle was inserted toward the target using a "trajectory" view along the fluoroscope beam.  Under AP and lateral visualization, the needle was advanced so it did not puncture dura and was located close the 6 O'Clock position of the pedical in AP tracterory. Biplanar projections were used to confirm position. Aspiration was confirmed to be negative for CSF and/or blood. A 1-2 ml. volume of Isovue-250 was injected and flow of contrast was noted at each level. Radiographs were obtained for documentation purposes.   After attaining the desired flow of contrast documented above, a 0.5 to 1.0 ml test dose of 0.25% Marcaine was  injected into each respective transforaminal space.  The patient was observed for 90 seconds post injection.  After no sensory deficits were reported, and normal lower extremity motor function was noted,   the above injectate was administered so that equal amounts of the injectate were placed at each foramen (level) into the transforaminal epidural space.   Additional Comments:  The patient tolerated the procedure well Dressing: Band-Aid    Post-procedure details: Patient was observed during the procedure. Post-procedure instructions were reviewed.  Patient left the clinic in stable condition.       Clinical History: Lumbar Spine MRI 11/05/2012   IMPRESSION: The dominant right-sided abnormality is at L4-5 where a 6 x 8 mm synovial cyst projects into the lateral recess and foramen. Bilateral facet arthropathy is present with facet joint effusions. Right L5 and possibly right L4 nerve root impingement are noted.  Moderate central canal stenosis at L3-4 is multifactorial, secondary to central disc protrusion and posterior element hypertrophy. Bilateral L3 and L4 nerve root impingement are possible.  MRI CERVICAL SPINE WITHOUT CONTRAST 08/29/2016  IMPRESSION: 1. Multilevel right-greater-than-left facet arthrosis, which may be a source of  local neck pain. This is greatest at right C2-C3 where there is moderate to severe edema. 2. 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 3. Moderate left neural foraminal stenosis at C3-C4 due to combination of uncovertebral and facet hypertrophy. 4. Mild spinal canal stenosis at C6-C7 with moderate bilateral neural foraminal stenosis.  He reports that  has never smoked. he has never used smokeless tobacco. No results for input(s): HGBA1C, LABURIC in the last 8760 hours.  Objective:  VS:  HT:    WT:   BMI:     BP:131/71  HR:61bpm  TEMP:(!) 97.5 F (36.4  C)(Oral)  RESP:97 % Physical Exam  Constitutional: He is oriented to person, place, and time. He appears well-developed and well-nourished. No distress.  HENT:  Head: Normocephalic and atraumatic.  Nose: Nose normal.  Mouth/Throat: Oropharynx is clear and moist.  Eyes: Conjunctivae are normal. Pupils are equal, round, and reactive to light.  Neck: Normal range of motion. Neck supple.  Cardiovascular: Regular rhythm and intact distal pulses.  Pulmonary/Chest: Effort normal and breath sounds normal.  Abdominal: Soft. He exhibits no distension.  Musculoskeletal: He exhibits no deformity.  Cervical range of motion is limited with right rotation and extension.  He does have painful trigger points noted in the paraspinal region as well as scalene to the right.  He does have torticollis.  He has decent range of motion of the shoulders without frank impingement.  He has good upper body strength.  He has good lower body strength bilaterally without clonus.  No pain with hip rotation.  Neurological: He is alert and oriented to person, place, and time. He exhibits abnormal muscle tone. Coordination normal.  Skin: Skin is warm. No rash noted.  Psychiatric: He has a normal mood and affect. His behavior is normal.  Nursing note and vitals reviewed.   Ortho Exam Imaging: No results found.  Past Medical/Family/Surgical/Social History: Medications & Allergies reviewed per EMR Patient Active Problem List   Diagnosis Date Noted  . Cervicalgia 09/07/2016  . Cervical spondylosis without myelopathy 09/07/2016  . Myofascitis 09/07/2016  . Torticollis 09/07/2016   Past Medical History:  Diagnosis Date  . Arthritis   . High cholesterol   . Reflux    Family History  Problem Relation Age of Onset  . Lung cancer Father    Past Surgical History:  Procedure Laterality Date  . HEMORRHOID SURGERY    . ROTATOR CUFF REPAIR    . SHOULDER SURGERY     Social History   Occupational History  . Not on  file  Tobacco Use  . Smoking status: Never Smoker  . Smokeless tobacco: Never Used  Substance and Sexual Activity  . Alcohol use: No  . Drug use: No  . Sexual activity: Not on file

## 2017-01-06 NOTE — Procedures (Signed)
Lumbosacral Transforaminal Epidural Steroid Injection - Sub-Pedicular Approach with Fluoroscopic Guidance  Patient: Juan Kennedy      Date of Birth: March 09, 1940 MRN: [id number] PCP: Velna Hatchet, MD      Visit Date: 12/28/2016   Universal Protocol:    Date/Time: 12/28/2016  Consent Given By: the patient  Position: PRONE  Additional Comments: Vital signs were monitored before and after the procedure. Patient was prepped and draped in the usual sterile fashion. The correct patient, procedure, and site was verified.   Injection Procedure Details:  Procedure Site One Meds Administered:  Meds ordered this encounter  Medications  . lidocaine (PF) (XYLOCAINE) 1 % injection 2 mL  . betamethasone acetate-betamethasone sodium phosphate (CELESTONE) injection 12 mg    Laterality: Bilateral  Location/Site:  L4-L5  Needle size: 22 G  Needle type: Spinal  Needle Placement: Transforaminal  Findings:    -Comments: Excellent flow of contrast along the nerve and into the epidural space.  Procedure Details: After squaring off the end-plates to get a true AP view, the C-arm was positioned so that an oblique view of the foramen as noted above was visualized. The target area is just inferior to the "nose of the scotty dog" or sub pedicular. The soft tissues overlying this structure were infiltrated with 2-3 ml. of 1% Lidocaine without Epinephrine.  The spinal needle was inserted toward the target using a "trajectory" view along the fluoroscope beam.  Under AP and lateral visualization, the needle was advanced so it did not puncture dura and was located close the 6 O'Clock position of the pedical in AP tracterory. Biplanar projections were used to confirm position. Aspiration was confirmed to be negative for CSF and/or blood. A 1-2 ml. volume of Isovue-250 was injected and flow of contrast was noted at each level. Radiographs were obtained for documentation purposes.   After attaining  the desired flow of contrast documented above, a 0.5 to 1.0 ml test dose of 0.25% Marcaine was injected into each respective transforaminal space.  The patient was observed for 90 seconds post injection.  After no sensory deficits were reported, and normal lower extremity motor function was noted,   the above injectate was administered so that equal amounts of the injectate were placed at each foramen (level) into the transforaminal epidural space.   Additional Comments:  The patient tolerated the procedure well Dressing: Band-Aid    Post-procedure details: Patient was observed during the procedure. Post-procedure instructions were reviewed.  Patient left the clinic in stable condition.

## 2017-01-10 ENCOUNTER — Encounter (INDEPENDENT_AMBULATORY_CARE_PROVIDER_SITE_OTHER): Payer: Self-pay | Admitting: Physical Medicine and Rehabilitation

## 2017-01-11 ENCOUNTER — Encounter (INDEPENDENT_AMBULATORY_CARE_PROVIDER_SITE_OTHER): Payer: Medicare HMO | Admitting: Physical Medicine and Rehabilitation

## 2017-01-15 ENCOUNTER — Ambulatory Visit (INDEPENDENT_AMBULATORY_CARE_PROVIDER_SITE_OTHER): Payer: Self-pay

## 2017-01-15 ENCOUNTER — Encounter (INDEPENDENT_AMBULATORY_CARE_PROVIDER_SITE_OTHER): Payer: Self-pay | Admitting: Physical Medicine and Rehabilitation

## 2017-01-15 ENCOUNTER — Ambulatory Visit (INDEPENDENT_AMBULATORY_CARE_PROVIDER_SITE_OTHER): Payer: Medicare HMO | Admitting: Physical Medicine and Rehabilitation

## 2017-01-15 VITALS — BP 114/78 | HR 65 | Temp 97.7°F

## 2017-01-15 DIAGNOSIS — M5010 Cervical disc disorder with radiculopathy, unspecified cervical region: Secondary | ICD-10-CM | POA: Diagnosis not present

## 2017-01-15 DIAGNOSIS — M542 Cervicalgia: Secondary | ICD-10-CM

## 2017-01-15 DIAGNOSIS — M5412 Radiculopathy, cervical region: Secondary | ICD-10-CM

## 2017-01-15 MED ORDER — METHYLPREDNISOLONE ACETATE 80 MG/ML IJ SUSP
80.0000 mg | Freq: Once | INTRAMUSCULAR | Status: AC
  Administered 2017-01-15: 80 mg

## 2017-01-15 MED ORDER — LIDOCAINE HCL (PF) 1 % IJ SOLN
2.0000 mL | Freq: Once | INTRAMUSCULAR | Status: AC
  Administered 2017-01-15: 2 mL

## 2017-01-15 NOTE — Patient Instructions (Signed)

## 2017-01-15 NOTE — Progress Notes (Signed)
Mr. Juan Kennedy is a 76 year old right-hand-dominant gentleman who we have recently been seeing for both his neck and his lower back.  In terms of his lower back we completed recent transforaminal epidural steroid injection which is helped to a degree but it seems to be fleeting.  He did report that he was doing fairly well up until he had the shovel snow during the big snowfall that we had.  He does have significant spondylosis and facet arthropathy causing stenosis at L4-5 predominantly.  In the past he has had a known facet joint cyst which was causing impacting the canal and did well with prior facet joint blocks but this is just not helped recently either.  In terms of his neck pain, as we have previously documented he has had this right-sided axial neck pain.  He does suffer from torticollis.  He is not receiving any Botox injections at this point.  He is on muscle relaxers, tizanidine.  He has had physical therapy with [name] a [facility].  Initially the physical therapy was very beneficial for the same pain that is on the right side of the cervical spine.  Then the patient reported going back and having more extensive dry needling performed which seems to really flared things up.  I have completed trigger point injection as well as facet joint blocks without relief.  He came in today for epidural injection as a one-time injection diagnostically to see if this would help at all.  He notes in the procedure note pleat the injection.  We did complete good loss of resistance and needle placement into the epidural space really without much difficulty flow how we ended up repositioning the needle.  Try to move up one level but vascular flow was still obtained.  Procedure was aborted at that point.  I do want the patient to follow-up with therapy to see if they can help him with his neck pain and his back pain.  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

## 2017-01-17 ENCOUNTER — Encounter (INDEPENDENT_AMBULATORY_CARE_PROVIDER_SITE_OTHER): Payer: Self-pay | Admitting: Physical Medicine and Rehabilitation

## 2017-01-19 DIAGNOSIS — H5203 Hypermetropia, bilateral: Secondary | ICD-10-CM | POA: Diagnosis not present

## 2017-01-19 DIAGNOSIS — H04123 Dry eye syndrome of bilateral lacrimal glands: Secondary | ICD-10-CM | POA: Diagnosis not present

## 2017-01-19 DIAGNOSIS — H26493 Other secondary cataract, bilateral: Secondary | ICD-10-CM | POA: Diagnosis not present

## 2017-01-19 DIAGNOSIS — H524 Presbyopia: Secondary | ICD-10-CM | POA: Diagnosis not present

## 2017-01-19 DIAGNOSIS — H52223 Regular astigmatism, bilateral: Secondary | ICD-10-CM | POA: Diagnosis not present

## 2017-01-20 NOTE — Procedures (Signed)
Cervical Epidural Steroid Injection - Interlaminar Approach with Fluoroscopic Guidance  Patient: Juan Kennedy      Date of Birth: 07-26-1940 MRN: [id number] PCP: Velna Hatchet, MD      Visit Date: 01/15/2017   Universal Protocol:    Date/Time: 12/19/185:43 AM  Consent Given By: the patient  Position: PRONE  Additional Comments: Vital signs were monitored before and after the procedure. Patient was prepped and draped in the usual sterile fashion. The correct patient, procedure, and site was verified.   Injection Procedure Details:  Procedure Site One Meds Administered:  Meds ordered this encounter  Medications  . lidocaine (PF) (XYLOCAINE) 1 % injection 2 mL  . methylPREDNISolone acetate (DEPO-MEDROL) injection 80 mg     Laterality: Right  Location/Site:  C7-T1  Needle size: 20 G  Needle type: Touhy  Needle Placement: Paramedian epidural space  Findings:  -Comments: There was excellent biplanar positioning of the needle in the epidural space as well as excellent loss of resistance technique but unfortunately with injected contrast there was venous flow pretty extensively to a plexus fashion bilaterally.  At the needle was repositioned several times but to no avail as there continued to be venous flow.  Actually did reposition the needle even one level higher than C7-T1 with good loss of resistance but again flow contrast showed more of a venous plexus flow.  At that point the procedure was discontinued.  100% of the procedure was performed we were just unable to deliver the injectate.  Procedure Details: Using a paramedian approach from the side mentioned above, the region overlying the inferior lamina was localized under fluoroscopic visualization and the soft tissues overlying this structure were infiltrated with 4 ml. of 1% Lidocaine without Epinephrine. A # 20 gauge, Tuohy needle was inserted into the epidural space using a paramedian approach.  The epidural  space was localized using loss of resistance along with lateral and contralateral oblique bi-planar fluoroscopic views.  After negative aspirate for air, blood, and CSF, a 2 ml. volume of Isovue-250 was injected into the epidural space and the flow of contrast was observed. Radiographs were obtained for documentation purposes.   The injectate was administered into the level noted above.  Additional Comments:  The patient tolerated the procedure well Dressing: Band-Aid    Post-procedure details: Patient was observed during the procedure. Post-procedure instructions were reviewed.  Patient left the clinic in stable condition.

## 2017-02-04 DIAGNOSIS — M25552 Pain in left hip: Secondary | ICD-10-CM | POA: Diagnosis not present

## 2017-02-04 DIAGNOSIS — M79605 Pain in left leg: Secondary | ICD-10-CM | POA: Diagnosis not present

## 2017-02-04 DIAGNOSIS — M25551 Pain in right hip: Secondary | ICD-10-CM | POA: Diagnosis not present

## 2017-02-04 DIAGNOSIS — M79604 Pain in right leg: Secondary | ICD-10-CM | POA: Diagnosis not present

## 2017-02-08 DIAGNOSIS — M79604 Pain in right leg: Secondary | ICD-10-CM | POA: Diagnosis not present

## 2017-02-08 DIAGNOSIS — M25552 Pain in left hip: Secondary | ICD-10-CM | POA: Diagnosis not present

## 2017-02-08 DIAGNOSIS — M79605 Pain in left leg: Secondary | ICD-10-CM | POA: Diagnosis not present

## 2017-02-08 DIAGNOSIS — M25551 Pain in right hip: Secondary | ICD-10-CM | POA: Diagnosis not present

## 2017-02-09 ENCOUNTER — Ambulatory Visit: Payer: Medicare HMO | Admitting: Gastroenterology

## 2017-02-09 ENCOUNTER — Encounter: Payer: Self-pay | Admitting: Gastroenterology

## 2017-02-09 VITALS — BP 134/70 | HR 88 | Ht 66.0 in | Wt 164.0 lb

## 2017-02-09 DIAGNOSIS — K648 Other hemorrhoids: Secondary | ICD-10-CM | POA: Diagnosis not present

## 2017-02-09 DIAGNOSIS — K602 Anal fissure, unspecified: Secondary | ICD-10-CM | POA: Diagnosis not present

## 2017-02-09 MED ORDER — AMBULATORY NON FORMULARY MEDICATION: 0 refills | Status: DC

## 2017-02-09 NOTE — Progress Notes (Signed)
HPI :  77 year old male here for follow-up visit. He was previously seen in April 2018 by Ellouise Newer for symptoms concerning for internal hemorrhoid bleeding. He was scheduled for a banding procedure at that time but never followed up for this. He states his symptoms improved for a period of time and did not think he needed further therapy.  He reports today he is having mostly pain in his anal canal, ongoing on for the past 3-4 weeks. He feels pain mostly only when passing stool, but he can stay for sometimes with a bowel movement. He has occasional rare blood on the toilet paper but not stools.Marland Kitchen Has been using sitz baths with can help with this. Usually has one bowel movement a day and denies any obvious straining. He is using Metamucil every night. He did have a colonoscopy in 2015 at [facility] which showed one small adenoma, diverticulosis, was normal. He endorses having hemorrhoidectomy in the 1990s, which she states was extremely painful and difficult to recover from. He has no history of anemia.Marland Kitchen He denies any abdominal pains, no weight loss, no family history of colon cancer. 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  Endoscopic evaluation previously at [facility]:  EGD 03/17/13: by Dr. Jerene Pitch at [facility] EGD showed normal stomach, normal duodenum, normal Z line, small hiatal hernia at the GE junction Colonoscopy 03/17/13 -  5 mm polyp in the descending colon. This is found to be adenomatous and patient was told to return in 5 years for follow-up. Moderate diverticulosis in the descending colon   Labs 03/11/16 - normal Hgb of 14s, normal iron studies    Past Medical History:  Diagnosis Date  . Arthritis   . High cholesterol   . Reflux      Past Surgical History:  Procedure Laterality Date  . HEMORRHOID SURGERY    . ROTATOR CUFF REPAIR Right   . SHOULDER SURGERY Left    Family History  Problem  Relation Age of Onset  . Lung cancer Father   . Colon cancer Neg Hx   . Esophageal cancer Neg Hx   . Stomach cancer Neg Hx   . Pancreatic cancer Neg Hx   . Liver disease Neg Hx    Social History   Tobacco Use  . Smoking status: Former Research scientist (life sciences)  . Smokeless tobacco: Never Used  . Tobacco comment: light smoker until early 90s  Substance Use Topics  . Alcohol use: No  . Drug use: No   Current Outpatient Medications  Medication Sig Dispense Refill  . atorvastatin (LIPITOR) 20 MG tablet Take 20 mg by mouth daily.     . Calcium-Magnesium-Vitamin D (CALCIUM 1200+D3 PO) Take 1 tablet by mouth daily.    . clonazePAM (KLONOPIN) 1 MG tablet 2 mg 2 (two) times daily.     Marland Kitchen desonide (DESOWEN) 0.05 % cream Apply 1 application topically as needed.   11  . doxazosin (CARDURA) 4 MG tablet Take 4 mg by mouth daily.     Noelle Penner FIBER SUPPLEMENT PO Take 2 tablets by mouth at bedtime.    . hydrocortisone 2.5 % cream Apply topically 2 (two) times daily.    . naproxen (NAPROSYN) 500 MG tablet Take 500 mg by mouth as needed.     . pantoprazole (PROTONIX) 40 MG tablet Take 40 mg by mouth daily.    . temazepam (RESTORIL) 15 MG capsule Take 15  mg by mouth as needed for sleep.    Marland Kitchen tiZANidine (ZANAFLEX) 4 MG tablet Take 4 mg by mouth 2 (two) times daily.      No current facility-administered medications for this visit.    Allergies  Allergen Reactions  . Sulfa Antibiotics Rash    Fever , rash      Review of Systems: All systems reviewed and negative except where noted in HPI.    Labs per HPI  Physical Exam: BP 134/70   Pulse 88   Ht 5\' 6"  (1.676 m)   Wt 164 lb (74.4 kg)   BMI 26.47 kg/m  Constitutional: Pleasant,well-developed, male in no acute distress. HEENT: Normocephalic and atraumatic. Conjunctivae are normal. No scleral icterus. Neck supple.  Cardiovascular: Normal rate, regular rhythm.  Pulmonary/chest: Effort normal and breath sounds normal. No wheezing, rales or rhonchi. Abdominal:  Soft, nondistended, nontender.  There are no masses palpable. No hepatomegaly. DRE / Anoscopy - small midline posterior to left sided anal fissure, anoscopy shows internal hemorrhoids in all positions Extremities: no edema Lymphadenopathy: No cervical adenopathy noted. Neurological: Alert and oriented to person place and time. Skin: Skin is warm and dry. No rashes noted. Psychiatric: Normal mood and affect. Behavior is normal.   ASSESSMENT AND PLAN: 77 year old male here for follow-up visit to discuss the following issues:  Anal fissure / internal hemorrhoids - intermittent anal discomfort for the past few weeks. DRE and anoscopy showed a small anal fissure which I suspect is causing his pain. He also has internal hemorrhoids noted however I don't think these are causing his discomfort. I'm recommending a course of topical nitroglycerin 0.125%, pea-sized amount applied every 8 hours for a few weeks. If this does not improve his symptoms asked him to contact me, I will reexamine him and assess for interval healing. He agreed with the plan and understood to call me if his symptoms are not improving. He otherwise had a colonoscopy done in 2015 without any high-risk lesions, due for surveillance colonoscopy in 2020 if his symptoms otherwise resolve in the interim. He should avoid straining and continue metamucil.   Almena Cellar, MD [facility] Pager [phone]

## 2017-02-09 NOTE — Patient Instructions (Signed)
If you are age 76 or older, your body mass index should be between 23-30. Your Body mass index is 26.47 kg/m. If this is out of the aforementioned range listed, please consider follow up with your Primary Care Provider.  If you are age 38 or younger, your body mass index should be between 19-25. Your Body mass index is 26.47 kg/m. If this is out of the aformentioned range listed, please consider follow up with your Primary Care Provider.   We have sent a prescription for nitroglycerin 0.125% gel to [facility]. You should apply a pea size amount to your rectum three times daily x 6-8 weeks.  [facility]'s information is below: Address: [address] [place], [place] [address]  [phone]  Please call us in 2 to 3 weeks and let us know how you're doing.   Thank you for entrusting me with your care and for [facility], Dr. [place] Cellar

## 2017-02-10 DIAGNOSIS — M25551 Pain in right hip: Secondary | ICD-10-CM | POA: Diagnosis not present

## 2017-02-10 DIAGNOSIS — M25552 Pain in left hip: Secondary | ICD-10-CM | POA: Diagnosis not present

## 2017-02-10 DIAGNOSIS — M79605 Pain in left leg: Secondary | ICD-10-CM | POA: Diagnosis not present

## 2017-02-10 DIAGNOSIS — M79604 Pain in right leg: Secondary | ICD-10-CM | POA: Diagnosis not present

## 2017-02-17 DIAGNOSIS — M25551 Pain in right hip: Secondary | ICD-10-CM | POA: Diagnosis not present

## 2017-02-17 DIAGNOSIS — M25552 Pain in left hip: Secondary | ICD-10-CM | POA: Diagnosis not present

## 2017-02-17 DIAGNOSIS — M79605 Pain in left leg: Secondary | ICD-10-CM | POA: Diagnosis not present

## 2017-02-17 DIAGNOSIS — M79604 Pain in right leg: Secondary | ICD-10-CM | POA: Diagnosis not present

## 2017-02-18 ENCOUNTER — Telehealth: Payer: Self-pay | Admitting: Gastroenterology

## 2017-02-18 NOTE — Telephone Encounter (Signed)
Patient states he has questions regarding medication nitroglycerine ointment and the exact instructions for it.

## 2017-02-18 NOTE — Telephone Encounter (Signed)
Called and spoke to patient.  He is doing much better with his fissure. Answered all questions regarding nitroglycerin use.  Encouraged pt to call us back after several weeks if he is not improved to his liking.

## 2017-02-19 DIAGNOSIS — M79604 Pain in right leg: Secondary | ICD-10-CM | POA: Diagnosis not present

## 2017-02-19 DIAGNOSIS — M25552 Pain in left hip: Secondary | ICD-10-CM | POA: Diagnosis not present

## 2017-02-19 DIAGNOSIS — M25551 Pain in right hip: Secondary | ICD-10-CM | POA: Diagnosis not present

## 2017-02-19 DIAGNOSIS — M79605 Pain in left leg: Secondary | ICD-10-CM | POA: Diagnosis not present

## 2017-02-22 DIAGNOSIS — M25552 Pain in left hip: Secondary | ICD-10-CM | POA: Diagnosis not present

## 2017-02-22 DIAGNOSIS — M79604 Pain in right leg: Secondary | ICD-10-CM | POA: Diagnosis not present

## 2017-02-22 DIAGNOSIS — M25551 Pain in right hip: Secondary | ICD-10-CM | POA: Diagnosis not present

## 2017-02-22 DIAGNOSIS — M79605 Pain in left leg: Secondary | ICD-10-CM | POA: Diagnosis not present

## 2017-02-23 ENCOUNTER — Telehealth: Payer: Self-pay | Admitting: Gastroenterology

## 2017-02-23 NOTE — Telephone Encounter (Signed)
Patient calling back to check in from ov on 1.8.19. Patient states he is doing fine and the medication seems to be helping. Pt does want to know how long he needs to use medication.

## 2017-02-23 NOTE — Telephone Encounter (Signed)
Patient was given topical nitroglycerin to use for an anal fissure. He has been on it approximately 2 weeks. If his symptoms have completely resolved, he can stop it at this time and use it PRN if symptoms recur. If symptoms improved but not resolved completely, I would use it another few weeks or until symptoms completely resolve. Sometimes it can take a few weeks to heal it completely. Thanks

## 2017-02-23 NOTE — Telephone Encounter (Signed)
Left message for patient to call back  

## 2017-02-23 NOTE — Telephone Encounter (Signed)
Patient advised of recommendations. He will continue current treatment for another week and if completely resolved will stop and use PRN.

## 2017-02-23 NOTE — Telephone Encounter (Signed)
Routed to Dr. Armbruster. 

## 2017-02-25 DIAGNOSIS — M79605 Pain in left leg: Secondary | ICD-10-CM | POA: Diagnosis not present

## 2017-02-25 DIAGNOSIS — M25551 Pain in right hip: Secondary | ICD-10-CM | POA: Diagnosis not present

## 2017-02-25 DIAGNOSIS — M79604 Pain in right leg: Secondary | ICD-10-CM | POA: Diagnosis not present

## 2017-02-25 DIAGNOSIS — M25552 Pain in left hip: Secondary | ICD-10-CM | POA: Diagnosis not present

## 2017-03-01 DIAGNOSIS — M79605 Pain in left leg: Secondary | ICD-10-CM | POA: Diagnosis not present

## 2017-03-01 DIAGNOSIS — M79604 Pain in right leg: Secondary | ICD-10-CM | POA: Diagnosis not present

## 2017-03-01 DIAGNOSIS — M25551 Pain in right hip: Secondary | ICD-10-CM | POA: Diagnosis not present

## 2017-03-01 DIAGNOSIS — M25552 Pain in left hip: Secondary | ICD-10-CM | POA: Diagnosis not present

## 2017-03-03 DIAGNOSIS — M25552 Pain in left hip: Secondary | ICD-10-CM | POA: Diagnosis not present

## 2017-03-03 DIAGNOSIS — M25551 Pain in right hip: Secondary | ICD-10-CM | POA: Diagnosis not present

## 2017-03-03 DIAGNOSIS — M79604 Pain in right leg: Secondary | ICD-10-CM | POA: Diagnosis not present

## 2017-03-03 DIAGNOSIS — M79605 Pain in left leg: Secondary | ICD-10-CM | POA: Diagnosis not present

## 2017-03-08 DIAGNOSIS — M79604 Pain in right leg: Secondary | ICD-10-CM | POA: Diagnosis not present

## 2017-03-08 DIAGNOSIS — M25552 Pain in left hip: Secondary | ICD-10-CM | POA: Diagnosis not present

## 2017-03-08 DIAGNOSIS — M25551 Pain in right hip: Secondary | ICD-10-CM | POA: Diagnosis not present

## 2017-03-08 DIAGNOSIS — M79605 Pain in left leg: Secondary | ICD-10-CM | POA: Diagnosis not present

## 2017-03-11 DIAGNOSIS — Z125 Encounter for screening for malignant neoplasm of prostate: Secondary | ICD-10-CM | POA: Diagnosis not present

## 2017-03-11 DIAGNOSIS — M79604 Pain in right leg: Secondary | ICD-10-CM | POA: Diagnosis not present

## 2017-03-11 DIAGNOSIS — M25551 Pain in right hip: Secondary | ICD-10-CM | POA: Diagnosis not present

## 2017-03-11 DIAGNOSIS — M79605 Pain in left leg: Secondary | ICD-10-CM | POA: Diagnosis not present

## 2017-03-11 DIAGNOSIS — M25552 Pain in left hip: Secondary | ICD-10-CM | POA: Diagnosis not present

## 2017-03-11 DIAGNOSIS — N401 Enlarged prostate with lower urinary tract symptoms: Secondary | ICD-10-CM | POA: Diagnosis not present

## 2017-03-15 DIAGNOSIS — N401 Enlarged prostate with lower urinary tract symptoms: Secondary | ICD-10-CM | POA: Diagnosis not present

## 2017-03-15 DIAGNOSIS — R972 Elevated prostate specific antigen [PSA]: Secondary | ICD-10-CM | POA: Diagnosis not present

## 2017-03-15 DIAGNOSIS — N138 Other obstructive and reflux uropathy: Secondary | ICD-10-CM | POA: Diagnosis not present

## 2017-03-15 DIAGNOSIS — N528 Other male erectile dysfunction: Secondary | ICD-10-CM | POA: Diagnosis not present

## 2017-03-16 DIAGNOSIS — M79605 Pain in left leg: Secondary | ICD-10-CM | POA: Diagnosis not present

## 2017-03-16 DIAGNOSIS — M25551 Pain in right hip: Secondary | ICD-10-CM | POA: Diagnosis not present

## 2017-03-16 DIAGNOSIS — M25552 Pain in left hip: Secondary | ICD-10-CM | POA: Diagnosis not present

## 2017-03-16 DIAGNOSIS — M79604 Pain in right leg: Secondary | ICD-10-CM | POA: Diagnosis not present

## 2017-03-19 DIAGNOSIS — M79604 Pain in right leg: Secondary | ICD-10-CM | POA: Diagnosis not present

## 2017-03-19 DIAGNOSIS — M25551 Pain in right hip: Secondary | ICD-10-CM | POA: Diagnosis not present

## 2017-03-19 DIAGNOSIS — M25552 Pain in left hip: Secondary | ICD-10-CM | POA: Diagnosis not present

## 2017-03-19 DIAGNOSIS — M79605 Pain in left leg: Secondary | ICD-10-CM | POA: Diagnosis not present

## 2017-03-22 DIAGNOSIS — M79604 Pain in right leg: Secondary | ICD-10-CM | POA: Diagnosis not present

## 2017-03-22 DIAGNOSIS — M25551 Pain in right hip: Secondary | ICD-10-CM | POA: Diagnosis not present

## 2017-03-22 DIAGNOSIS — M25552 Pain in left hip: Secondary | ICD-10-CM | POA: Diagnosis not present

## 2017-03-22 DIAGNOSIS — M79605 Pain in left leg: Secondary | ICD-10-CM | POA: Diagnosis not present

## 2017-03-24 DIAGNOSIS — M79605 Pain in left leg: Secondary | ICD-10-CM | POA: Diagnosis not present

## 2017-03-24 DIAGNOSIS — M25551 Pain in right hip: Secondary | ICD-10-CM | POA: Diagnosis not present

## 2017-03-24 DIAGNOSIS — M79604 Pain in right leg: Secondary | ICD-10-CM | POA: Diagnosis not present

## 2017-03-24 DIAGNOSIS — M25552 Pain in left hip: Secondary | ICD-10-CM | POA: Diagnosis not present

## 2017-03-30 DIAGNOSIS — M79604 Pain in right leg: Secondary | ICD-10-CM | POA: Diagnosis not present

## 2017-03-30 DIAGNOSIS — M25551 Pain in right hip: Secondary | ICD-10-CM | POA: Diagnosis not present

## 2017-03-30 DIAGNOSIS — M79605 Pain in left leg: Secondary | ICD-10-CM | POA: Diagnosis not present

## 2017-03-30 DIAGNOSIS — M25552 Pain in left hip: Secondary | ICD-10-CM | POA: Diagnosis not present

## 2017-04-02 DIAGNOSIS — E7849 Other hyperlipidemia: Secondary | ICD-10-CM | POA: Diagnosis not present

## 2017-04-02 DIAGNOSIS — R82998 Other abnormal findings in urine: Secondary | ICD-10-CM | POA: Diagnosis not present

## 2017-04-07 DIAGNOSIS — M25551 Pain in right hip: Secondary | ICD-10-CM | POA: Diagnosis not present

## 2017-04-07 DIAGNOSIS — M79604 Pain in right leg: Secondary | ICD-10-CM | POA: Diagnosis not present

## 2017-04-07 DIAGNOSIS — M25552 Pain in left hip: Secondary | ICD-10-CM | POA: Diagnosis not present

## 2017-04-07 DIAGNOSIS — M79605 Pain in left leg: Secondary | ICD-10-CM | POA: Diagnosis not present

## 2017-04-09 DIAGNOSIS — Z1389 Encounter for screening for other disorder: Secondary | ICD-10-CM | POA: Diagnosis not present

## 2017-04-09 DIAGNOSIS — E7849 Other hyperlipidemia: Secondary | ICD-10-CM | POA: Diagnosis not present

## 2017-04-09 DIAGNOSIS — Z0000 Encounter for general adult medical examination without abnormal findings: Secondary | ICD-10-CM | POA: Diagnosis not present

## 2017-04-09 DIAGNOSIS — M436 Torticollis: Secondary | ICD-10-CM | POA: Diagnosis not present

## 2017-04-09 DIAGNOSIS — N400 Enlarged prostate without lower urinary tract symptoms: Secondary | ICD-10-CM | POA: Diagnosis not present

## 2017-04-09 DIAGNOSIS — R040 Epistaxis: Secondary | ICD-10-CM | POA: Diagnosis not present

## 2017-04-09 DIAGNOSIS — J449 Chronic obstructive pulmonary disease, unspecified: Secondary | ICD-10-CM | POA: Diagnosis not present

## 2017-04-09 DIAGNOSIS — G4709 Other insomnia: Secondary | ICD-10-CM | POA: Diagnosis not present

## 2017-04-09 DIAGNOSIS — K219 Gastro-esophageal reflux disease without esophagitis: Secondary | ICD-10-CM | POA: Diagnosis not present

## 2017-04-12 ENCOUNTER — Other Ambulatory Visit: Payer: Self-pay | Admitting: Internal Medicine

## 2017-04-12 DIAGNOSIS — Z87891 Personal history of nicotine dependence: Secondary | ICD-10-CM

## 2017-04-13 DIAGNOSIS — M25552 Pain in left hip: Secondary | ICD-10-CM | POA: Diagnosis not present

## 2017-04-13 DIAGNOSIS — M79605 Pain in left leg: Secondary | ICD-10-CM | POA: Diagnosis not present

## 2017-04-13 DIAGNOSIS — M79604 Pain in right leg: Secondary | ICD-10-CM | POA: Diagnosis not present

## 2017-04-13 DIAGNOSIS — Z1212 Encounter for screening for malignant neoplasm of rectum: Secondary | ICD-10-CM | POA: Diagnosis not present

## 2017-04-13 DIAGNOSIS — M25551 Pain in right hip: Secondary | ICD-10-CM | POA: Diagnosis not present

## 2017-04-15 DIAGNOSIS — M25551 Pain in right hip: Secondary | ICD-10-CM | POA: Diagnosis not present

## 2017-04-15 DIAGNOSIS — M79604 Pain in right leg: Secondary | ICD-10-CM | POA: Diagnosis not present

## 2017-04-15 DIAGNOSIS — M25552 Pain in left hip: Secondary | ICD-10-CM | POA: Diagnosis not present

## 2017-04-15 DIAGNOSIS — M79605 Pain in left leg: Secondary | ICD-10-CM | POA: Diagnosis not present

## 2017-04-19 DIAGNOSIS — M25552 Pain in left hip: Secondary | ICD-10-CM | POA: Diagnosis not present

## 2017-04-19 DIAGNOSIS — M79605 Pain in left leg: Secondary | ICD-10-CM | POA: Diagnosis not present

## 2017-04-19 DIAGNOSIS — M25551 Pain in right hip: Secondary | ICD-10-CM | POA: Diagnosis not present

## 2017-04-19 DIAGNOSIS — M79604 Pain in right leg: Secondary | ICD-10-CM | POA: Diagnosis not present

## 2017-04-22 DIAGNOSIS — M25551 Pain in right hip: Secondary | ICD-10-CM | POA: Diagnosis not present

## 2017-04-22 DIAGNOSIS — M79604 Pain in right leg: Secondary | ICD-10-CM | POA: Diagnosis not present

## 2017-04-22 DIAGNOSIS — M79605 Pain in left leg: Secondary | ICD-10-CM | POA: Diagnosis not present

## 2017-04-22 DIAGNOSIS — M25552 Pain in left hip: Secondary | ICD-10-CM | POA: Diagnosis not present

## 2017-04-26 DIAGNOSIS — M25552 Pain in left hip: Secondary | ICD-10-CM | POA: Diagnosis not present

## 2017-04-26 DIAGNOSIS — M25551 Pain in right hip: Secondary | ICD-10-CM | POA: Diagnosis not present

## 2017-04-26 DIAGNOSIS — M79604 Pain in right leg: Secondary | ICD-10-CM | POA: Diagnosis not present

## 2017-04-26 DIAGNOSIS — M79605 Pain in left leg: Secondary | ICD-10-CM | POA: Diagnosis not present

## 2017-04-28 DIAGNOSIS — M25551 Pain in right hip: Secondary | ICD-10-CM | POA: Diagnosis not present

## 2017-04-28 DIAGNOSIS — M25552 Pain in left hip: Secondary | ICD-10-CM | POA: Diagnosis not present

## 2017-04-28 DIAGNOSIS — M79604 Pain in right leg: Secondary | ICD-10-CM | POA: Diagnosis not present

## 2017-04-28 DIAGNOSIS — M79605 Pain in left leg: Secondary | ICD-10-CM | POA: Diagnosis not present

## 2017-05-03 DIAGNOSIS — M79605 Pain in left leg: Secondary | ICD-10-CM | POA: Diagnosis not present

## 2017-05-03 DIAGNOSIS — M79604 Pain in right leg: Secondary | ICD-10-CM | POA: Diagnosis not present

## 2017-05-03 DIAGNOSIS — M25551 Pain in right hip: Secondary | ICD-10-CM | POA: Diagnosis not present

## 2017-05-03 DIAGNOSIS — M25552 Pain in left hip: Secondary | ICD-10-CM | POA: Diagnosis not present

## 2017-05-06 DIAGNOSIS — M25551 Pain in right hip: Secondary | ICD-10-CM | POA: Diagnosis not present

## 2017-05-06 DIAGNOSIS — M79605 Pain in left leg: Secondary | ICD-10-CM | POA: Diagnosis not present

## 2017-05-06 DIAGNOSIS — M25552 Pain in left hip: Secondary | ICD-10-CM | POA: Diagnosis not present

## 2017-05-06 DIAGNOSIS — M79604 Pain in right leg: Secondary | ICD-10-CM | POA: Diagnosis not present

## 2017-05-11 DIAGNOSIS — M79604 Pain in right leg: Secondary | ICD-10-CM | POA: Diagnosis not present

## 2017-05-11 DIAGNOSIS — M25552 Pain in left hip: Secondary | ICD-10-CM | POA: Diagnosis not present

## 2017-05-11 DIAGNOSIS — M25551 Pain in right hip: Secondary | ICD-10-CM | POA: Diagnosis not present

## 2017-05-11 DIAGNOSIS — M79605 Pain in left leg: Secondary | ICD-10-CM | POA: Diagnosis not present

## 2017-05-13 DIAGNOSIS — M25552 Pain in left hip: Secondary | ICD-10-CM | POA: Diagnosis not present

## 2017-05-13 DIAGNOSIS — M79605 Pain in left leg: Secondary | ICD-10-CM | POA: Diagnosis not present

## 2017-05-13 DIAGNOSIS — M25551 Pain in right hip: Secondary | ICD-10-CM | POA: Diagnosis not present

## 2017-05-13 DIAGNOSIS — M79604 Pain in right leg: Secondary | ICD-10-CM | POA: Diagnosis not present

## 2017-05-14 DIAGNOSIS — H35372 Puckering of macula, left eye: Secondary | ICD-10-CM | POA: Diagnosis not present

## 2017-05-14 DIAGNOSIS — H26493 Other secondary cataract, bilateral: Secondary | ICD-10-CM | POA: Diagnosis not present

## 2017-05-14 DIAGNOSIS — H04123 Dry eye syndrome of bilateral lacrimal glands: Secondary | ICD-10-CM | POA: Diagnosis not present

## 2017-05-14 DIAGNOSIS — H43812 Vitreous degeneration, left eye: Secondary | ICD-10-CM | POA: Diagnosis not present

## 2017-05-18 DIAGNOSIS — M79605 Pain in left leg: Secondary | ICD-10-CM | POA: Diagnosis not present

## 2017-05-18 DIAGNOSIS — M25552 Pain in left hip: Secondary | ICD-10-CM | POA: Diagnosis not present

## 2017-05-18 DIAGNOSIS — M25551 Pain in right hip: Secondary | ICD-10-CM | POA: Diagnosis not present

## 2017-05-18 DIAGNOSIS — M79604 Pain in right leg: Secondary | ICD-10-CM | POA: Diagnosis not present

## 2017-05-20 DIAGNOSIS — M79605 Pain in left leg: Secondary | ICD-10-CM | POA: Diagnosis not present

## 2017-05-20 DIAGNOSIS — M25551 Pain in right hip: Secondary | ICD-10-CM | POA: Diagnosis not present

## 2017-05-20 DIAGNOSIS — M25552 Pain in left hip: Secondary | ICD-10-CM | POA: Diagnosis not present

## 2017-05-20 DIAGNOSIS — M79604 Pain in right leg: Secondary | ICD-10-CM | POA: Diagnosis not present

## 2017-05-25 DIAGNOSIS — M79605 Pain in left leg: Secondary | ICD-10-CM | POA: Diagnosis not present

## 2017-05-25 DIAGNOSIS — M25552 Pain in left hip: Secondary | ICD-10-CM | POA: Diagnosis not present

## 2017-05-25 DIAGNOSIS — M25551 Pain in right hip: Secondary | ICD-10-CM | POA: Diagnosis not present

## 2017-05-25 DIAGNOSIS — M79604 Pain in right leg: Secondary | ICD-10-CM | POA: Diagnosis not present

## 2017-05-27 DIAGNOSIS — M25552 Pain in left hip: Secondary | ICD-10-CM | POA: Diagnosis not present

## 2017-05-27 DIAGNOSIS — M79604 Pain in right leg: Secondary | ICD-10-CM | POA: Diagnosis not present

## 2017-05-27 DIAGNOSIS — M25551 Pain in right hip: Secondary | ICD-10-CM | POA: Diagnosis not present

## 2017-05-27 DIAGNOSIS — M79605 Pain in left leg: Secondary | ICD-10-CM | POA: Diagnosis not present

## 2017-06-01 DIAGNOSIS — M79604 Pain in right leg: Secondary | ICD-10-CM | POA: Diagnosis not present

## 2017-06-01 DIAGNOSIS — M79605 Pain in left leg: Secondary | ICD-10-CM | POA: Diagnosis not present

## 2017-06-01 DIAGNOSIS — M25551 Pain in right hip: Secondary | ICD-10-CM | POA: Diagnosis not present

## 2017-06-01 DIAGNOSIS — M25552 Pain in left hip: Secondary | ICD-10-CM | POA: Diagnosis not present

## 2017-06-03 DIAGNOSIS — M25552 Pain in left hip: Secondary | ICD-10-CM | POA: Diagnosis not present

## 2017-06-03 DIAGNOSIS — M79604 Pain in right leg: Secondary | ICD-10-CM | POA: Diagnosis not present

## 2017-06-03 DIAGNOSIS — M25551 Pain in right hip: Secondary | ICD-10-CM | POA: Diagnosis not present

## 2017-06-03 DIAGNOSIS — M79605 Pain in left leg: Secondary | ICD-10-CM | POA: Diagnosis not present

## 2017-06-09 DIAGNOSIS — M25552 Pain in left hip: Secondary | ICD-10-CM | POA: Diagnosis not present

## 2017-06-09 DIAGNOSIS — M79605 Pain in left leg: Secondary | ICD-10-CM | POA: Diagnosis not present

## 2017-06-09 DIAGNOSIS — M25551 Pain in right hip: Secondary | ICD-10-CM | POA: Diagnosis not present

## 2017-06-09 DIAGNOSIS — M79604 Pain in right leg: Secondary | ICD-10-CM | POA: Diagnosis not present

## 2017-06-11 DIAGNOSIS — M79605 Pain in left leg: Secondary | ICD-10-CM | POA: Diagnosis not present

## 2017-06-11 DIAGNOSIS — M79604 Pain in right leg: Secondary | ICD-10-CM | POA: Diagnosis not present

## 2017-06-11 DIAGNOSIS — M25552 Pain in left hip: Secondary | ICD-10-CM | POA: Diagnosis not present

## 2017-06-11 DIAGNOSIS — M25551 Pain in right hip: Secondary | ICD-10-CM | POA: Diagnosis not present

## 2017-06-16 DIAGNOSIS — M25551 Pain in right hip: Secondary | ICD-10-CM | POA: Diagnosis not present

## 2017-06-16 DIAGNOSIS — M79604 Pain in right leg: Secondary | ICD-10-CM | POA: Diagnosis not present

## 2017-06-16 DIAGNOSIS — M79605 Pain in left leg: Secondary | ICD-10-CM | POA: Diagnosis not present

## 2017-06-16 DIAGNOSIS — M25552 Pain in left hip: Secondary | ICD-10-CM | POA: Diagnosis not present

## 2017-06-18 DIAGNOSIS — H26493 Other secondary cataract, bilateral: Secondary | ICD-10-CM | POA: Diagnosis not present

## 2017-06-18 DIAGNOSIS — H43812 Vitreous degeneration, left eye: Secondary | ICD-10-CM | POA: Diagnosis not present

## 2017-06-18 DIAGNOSIS — H04123 Dry eye syndrome of bilateral lacrimal glands: Secondary | ICD-10-CM | POA: Diagnosis not present

## 2017-06-18 DIAGNOSIS — H35372 Puckering of macula, left eye: Secondary | ICD-10-CM | POA: Diagnosis not present

## 2017-07-05 ENCOUNTER — Ambulatory Visit (INDEPENDENT_AMBULATORY_CARE_PROVIDER_SITE_OTHER): Payer: Medicare HMO

## 2017-07-05 ENCOUNTER — Encounter (INDEPENDENT_AMBULATORY_CARE_PROVIDER_SITE_OTHER): Payer: Self-pay | Admitting: Orthopaedic Surgery

## 2017-07-05 ENCOUNTER — Ambulatory Visit (INDEPENDENT_AMBULATORY_CARE_PROVIDER_SITE_OTHER): Payer: Medicare HMO | Admitting: Orthopaedic Surgery

## 2017-07-05 DIAGNOSIS — M25551 Pain in right hip: Secondary | ICD-10-CM

## 2017-07-05 DIAGNOSIS — G8929 Other chronic pain: Secondary | ICD-10-CM

## 2017-07-05 DIAGNOSIS — M545 Low back pain: Secondary | ICD-10-CM

## 2017-07-05 MED ORDER — GABAPENTIN 100 MG PO CAPS: 100.0000 mg | ORAL_CAPSULE | Freq: Three times a day (TID) | ORAL | 1 refills | Status: DC

## 2017-07-05 NOTE — Progress Notes (Signed)
Office Visit Note   Patient: Juan Kennedy           Date of Birth: 04/30/1940           MRN: [id number] Visit Date: 07/05/2017              Requested by: Velna Hatchet, MD [address] [place], [place] [address] PCP: Velna Hatchet, MD   Assessment & Plan: Visit Diagnoses:  1. Right hip pain   2. Chronic low back pain, unspecified back pain laterality, with sciatica presence unspecified     Plan: He is now working with a trainer at [employer] and I think this will help with strengthening his back and core muscles as well as the lower extremities.  I do feel he should try supplements over-the-counter as well as trying Neurontin to start with 100 mg up to 3 times a day.  We will see him back in a month to see if we need to repeat an MRI of his lumbar spine says is 76 years old versus setting him up for repeat L4-L5 injections by Dr. Ernestina Patches.  All questions concerns were answered and addressed.  Follow-Up Instructions: Return in about 1 month (around 08/02/2017).   Orders:  Orders Placed This Encounter  Procedures  . XR HIP UNILAT W OR W/O PELVIS 1V RIGHT   Meds ordered this encounter  Medications  . gabapentin (NEURONTIN) 100 MG capsule    Sig: Take 1 capsule (100 mg total) by mouth 3 (three) times daily.    Dispense:  60 capsule    Refill:  1      Procedures: No procedures performed   Clinical Data: No additional findings.   Subjective: Chief Complaint  Patient presents with  . Right Hip - Pain  The patient is well-known to me.  He comes in with right hip pain for some time now but he says is really both of his hips.  He points to more of his back and sciatic area on both sides that radiates down to both knees and he does anywhere else.  There is no pain in the groin bilaterally.  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  MRI in 2014 showing this.  He has had interventions by Dr. Ernestina Patches and is back in the past especially  lumbar spine one time this helped significantly for around 10 months or so.  He denies any significant weakness in his feet or numbness and tingling.  He said he is very stiff in the mornings but he does get comfortable sleep at night.  HPI  Review of Systems He currently denies any headache, chest pain, shortness of breath, fever, chills, nausea, vomiting.   Objective: Vital Signs: There were no vitals taken for this visit.  Physical Exam He is alert and oriented x3 and in no acute distress examination of both hips are basically normal in terms of fluid and full range of motion with no pain in the groin or blocks rotation. Ortho Exam There is no pain of the trochanteric area on either side.  His pain is mainly in his low back. Specialty Comments:  No specialty comments available.  Imaging: Xr Hip Unilat W Or W/o Pelvis 1v Right  Result Date: 07/05/2017 An AP pelvis and lateral right hip shows normal bilateral hips.  There is no significant acute findings or cartilage changes.  The hip joint space is well-maintained bilaterally.    PMFS History: Patient Active Problem  List   Diagnosis Date Noted  . Cervicalgia 09/07/2016  . Cervical spondylosis without myelopathy 09/07/2016  . Myofascitis 09/07/2016  . Torticollis 09/07/2016   Past Medical History:  Diagnosis Date  . Arthritis   . High cholesterol   . Reflux     Family History  Problem Relation Age of Onset  . Lung cancer Father   . Colon cancer Neg Hx   . Esophageal cancer Neg Hx   . Stomach cancer Neg Hx   . Pancreatic cancer Neg Hx   . Liver disease Neg Hx     Past Surgical History:  Procedure Laterality Date  . HEMORRHOID SURGERY    . ROTATOR CUFF REPAIR Right   . SHOULDER SURGERY Left    Social History   Occupational History  . Not on file  Tobacco Use  . Smoking status: Former Research scientist (life sciences)  . Smokeless tobacco: Never Used  . Tobacco comment: light smoker until early 90s  Substance and Sexual Activity  .  Alcohol use: No  . Drug use: No  . Sexual activity: Not on file

## 2017-08-02 ENCOUNTER — Ambulatory Visit (INDEPENDENT_AMBULATORY_CARE_PROVIDER_SITE_OTHER): Payer: Medicare HMO | Admitting: Orthopaedic Surgery

## 2017-08-02 DIAGNOSIS — M542 Cervicalgia: Secondary | ICD-10-CM

## 2017-08-02 DIAGNOSIS — M545 Low back pain: Secondary | ICD-10-CM | POA: Diagnosis not present

## 2017-08-02 DIAGNOSIS — G8929 Other chronic pain: Secondary | ICD-10-CM

## 2017-08-02 NOTE — Progress Notes (Signed)
Juan Kennedy is well-known to me.  At his last visit I started him on Neurontin 100 mg at least 2-3 times a day with meals.  He is been taking that as well as Tume to help with aches and pains of his joints.  He has been working with a Physiological scientist and trying to get to the gym.  He says right now everything is stable.  His neck spasming which is a chronic issue is giving more problems in his back.  He has had no worsening in symptoms of his back or his neck.  He does feel that if he is motivated with getting to the gym he will do better.  On exam he does have some spasm of his neck more to the left than the right.  He has a negative straight leg raise on the right.  Overall he does appear stable.  He does have 300 mg of Neurontin at home he took for shingles remotely.  He is going to try that dose and if this works for him I can call in more and he will let us know.  He will otherwise follow-up as needed.  If there is any issues he will let us know.  I have encouraged him to continue his exercise routine because that is what is going to help him the most.

## 2017-08-10 ENCOUNTER — Other Ambulatory Visit (INDEPENDENT_AMBULATORY_CARE_PROVIDER_SITE_OTHER): Payer: Self-pay

## 2017-08-10 ENCOUNTER — Telehealth (INDEPENDENT_AMBULATORY_CARE_PROVIDER_SITE_OTHER): Payer: Self-pay | Admitting: Orthopaedic Surgery

## 2017-08-10 MED ORDER — GABAPENTIN 300 MG PO CAPS: 300.0000 mg | ORAL_CAPSULE | Freq: Three times a day (TID) | ORAL | 1 refills | Status: DC

## 2017-08-10 NOTE — Telephone Encounter (Signed)
Patient called this morning stating that he and Dr. Ninfa Linden discussed increasing his Gabapentin to the 300mg  capsule.  He uses the [facility] in pharmacy.  CB#[phone].  Thank you.

## 2017-08-10 NOTE — Telephone Encounter (Signed)
Please send in Neurontin 300 mg to take as needed 3 times a day, #180 with a refill.  Thank you very much.

## 2017-08-10 NOTE — Telephone Encounter (Signed)
Please advise 

## 2017-08-10 NOTE — Telephone Encounter (Signed)
Sent in to [facility] 

## 2017-09-07 DIAGNOSIS — R972 Elevated prostate specific antigen [PSA]: Secondary | ICD-10-CM | POA: Diagnosis not present

## 2017-09-10 DIAGNOSIS — N528 Other male erectile dysfunction: Secondary | ICD-10-CM | POA: Diagnosis not present

## 2017-09-10 DIAGNOSIS — N401 Enlarged prostate with lower urinary tract symptoms: Secondary | ICD-10-CM | POA: Diagnosis not present

## 2017-09-10 DIAGNOSIS — R972 Elevated prostate specific antigen [PSA]: Secondary | ICD-10-CM | POA: Diagnosis not present

## 2017-09-10 DIAGNOSIS — N138 Other obstructive and reflux uropathy: Secondary | ICD-10-CM | POA: Diagnosis not present

## 2017-09-24 IMAGING — MR MR CERVICAL SPINE W/O CM
4 of 5 series · 27 of 48 positions shown · non-contrast
Comparison: Cervical spine radiograph 06/18/2016

CLINICAL DATA: Right-sided neck pain

EXAM:
MRI CERVICAL SPINE WITHOUT CONTRAST
TECHNIQUE: Multiplanar, multisequence MR imaging of the cervical spine was
performed. No intravenous contrast was administered.

[Series 7: T1 · sagittal · 3.0mm · 0.66mm/px · 6 of 17 slices shown]
[im 1/17]
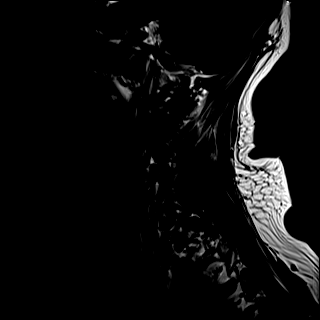
[im 4/17]
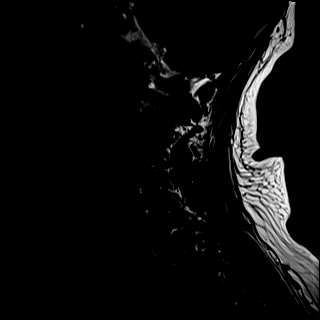
[im 7/17]
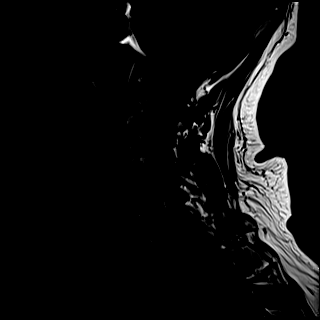
[im 10/17]
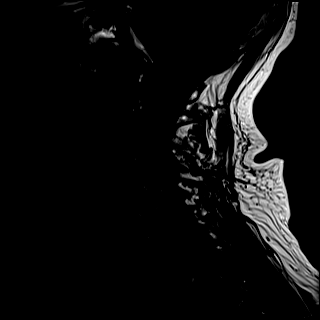
[im 13/17]
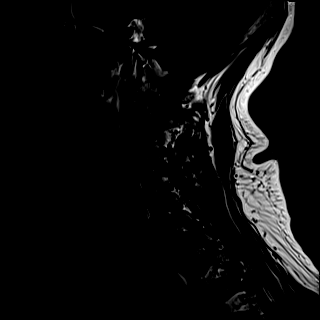
[im 17/17]
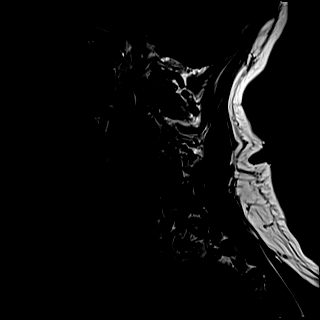

[Series 10: T2 · axial · 3.0mm · 0.50mm/px · z∈[-78,+63]mm · 9 of 45 slices shown (1 of 2)]
[im 1/45]
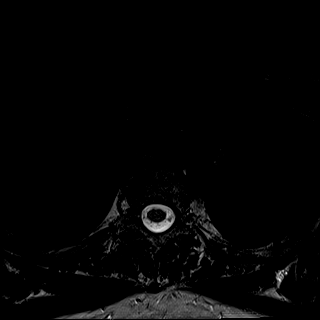
[im 7/45]
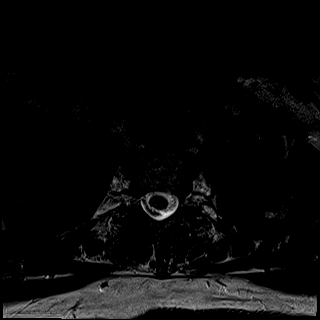
[im 13/45]
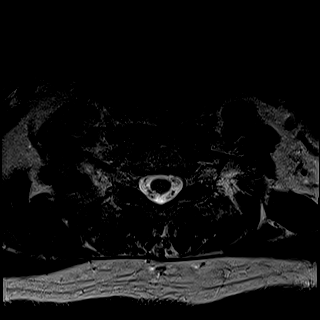
[im 19/45]
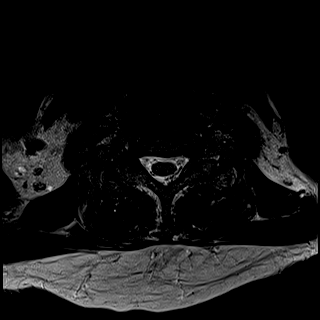
[im 23/45]
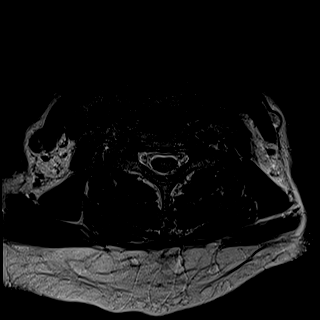
[im 26/45]
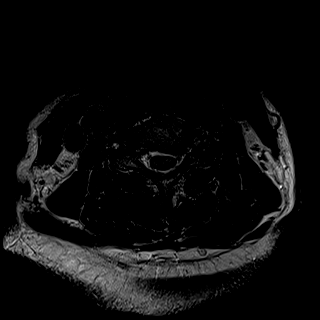
[im 32/45]
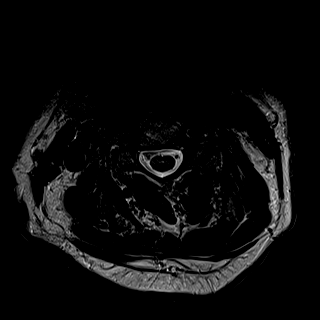
[im 38/45]
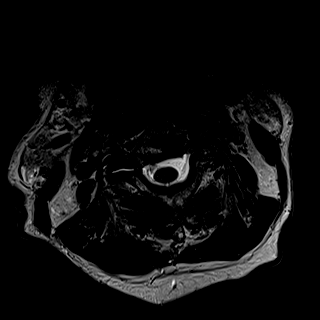
[im 45/45]
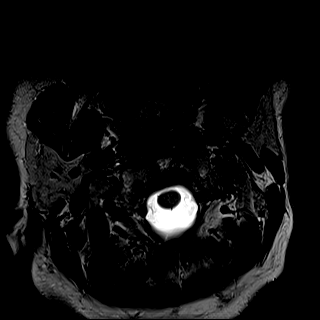

[Series 12: T2 · sagittal · 3.0mm · 0.55mm/px · 6 of 17 slices shown (2 of 2)]
[im 1/17]
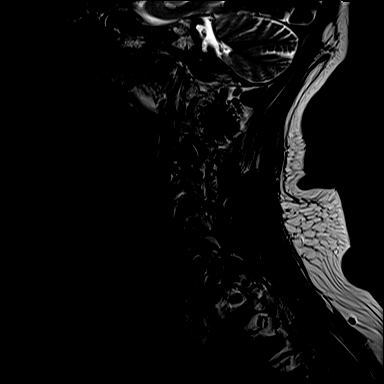
[im 4/17]
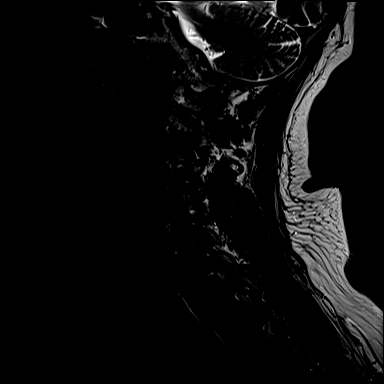
[im 7/17]
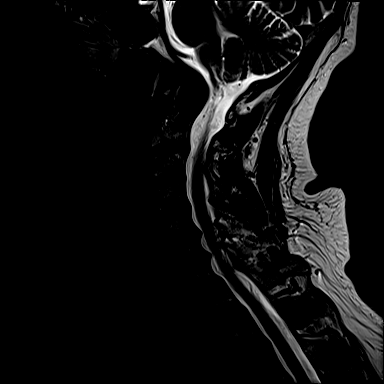
[im 10/17]
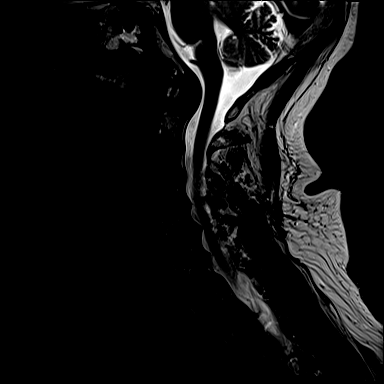
[im 13/17]
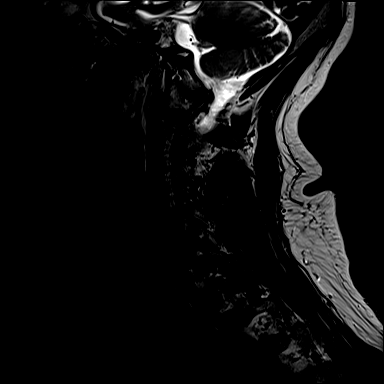
[im 17/17]
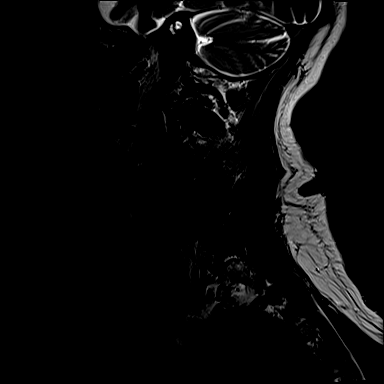

[Series 13: STIR · sagittal · 3.0mm · 0.33mm/px · 6 of 17 slices shown]
[im 1/17]
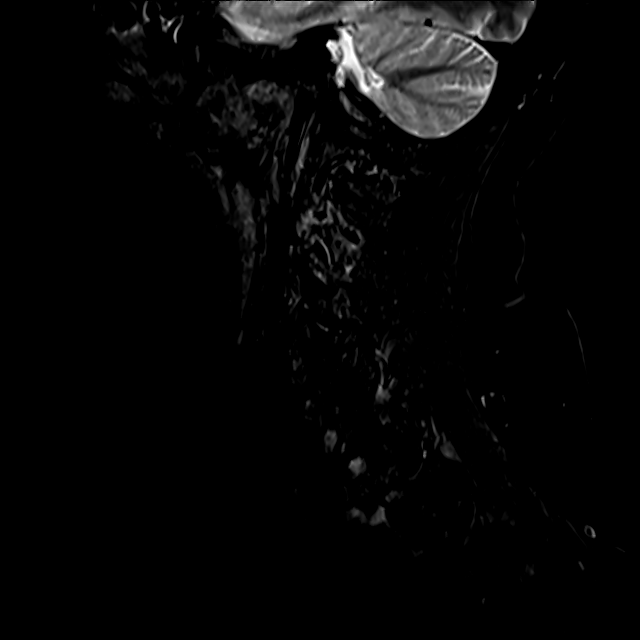
[im 4/17]
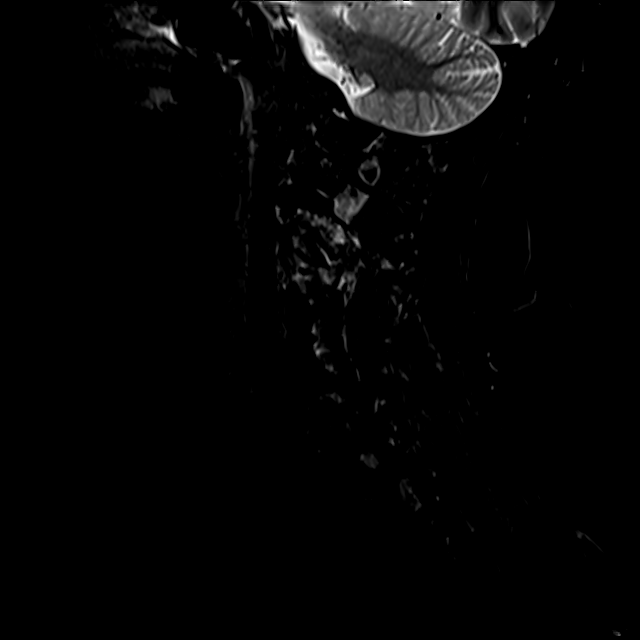
[im 7/17]
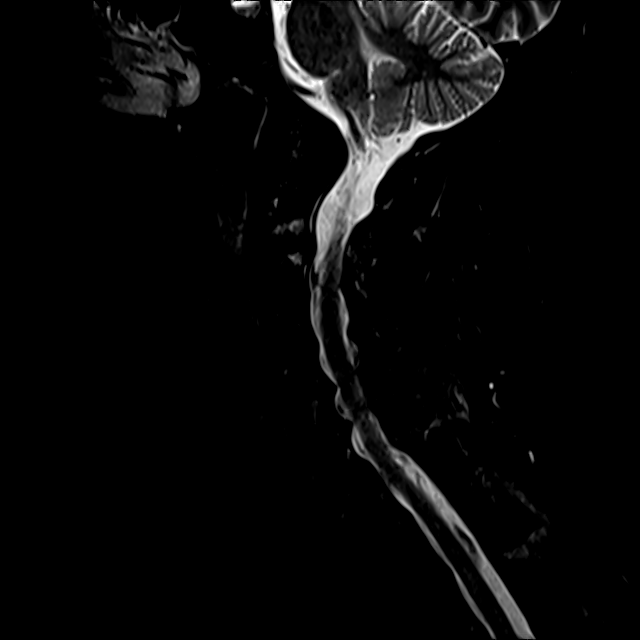
[im 10/17]
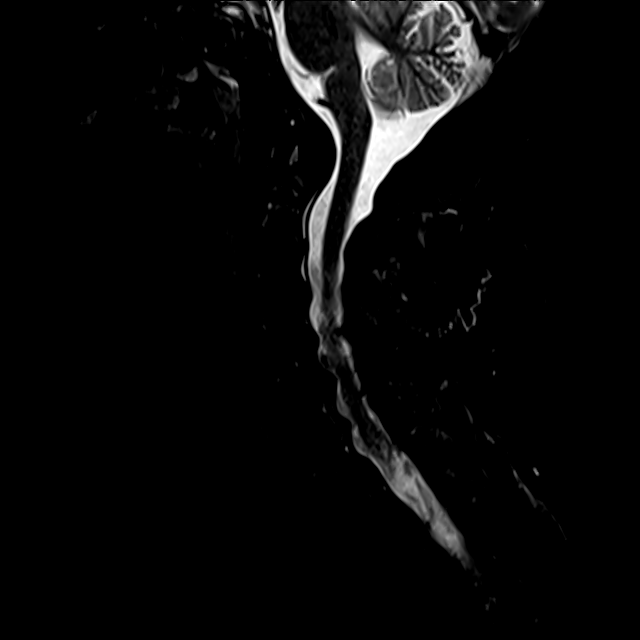
[im 13/17]
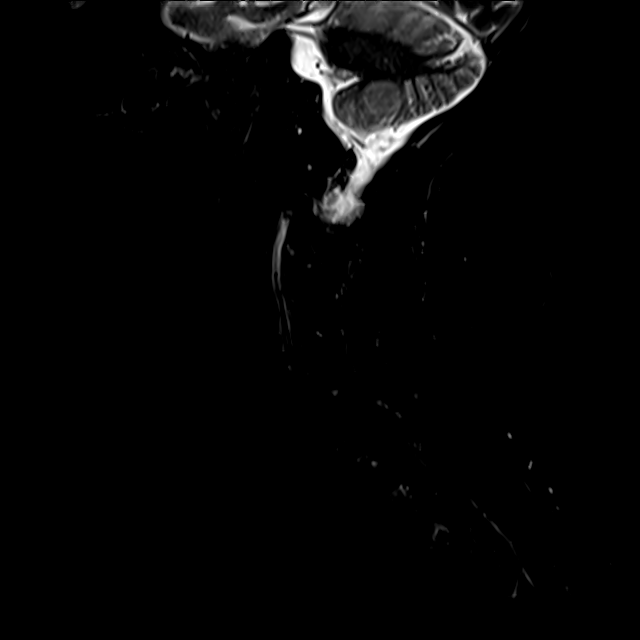
[im 17/17]
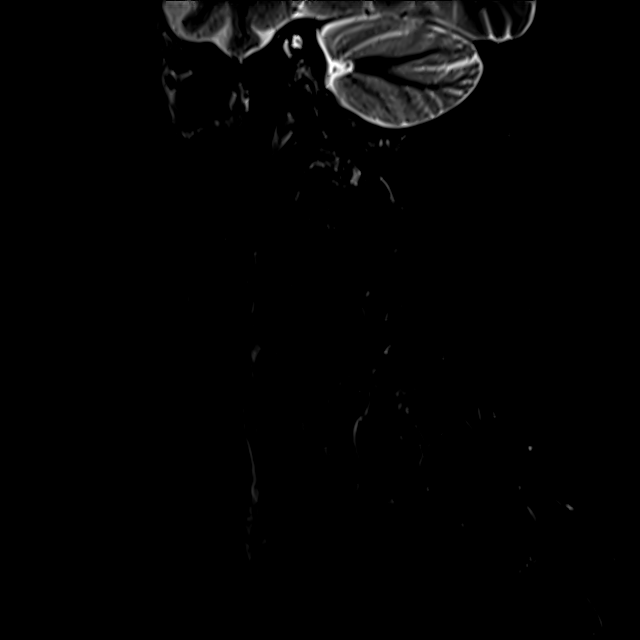

[27 of 48 positions shown; findings below may reference images not displayed]

FINDINGS: Alignment: Normal

Vertebrae: No fracture, evidence of discitis, or bone lesion.

Cord: Normal signal and morphology.

Posterior Fossa, vertebral arteries, paraspinal tissues: Negative.

Disc levels:

C1-C2: Normal.

C2-C3: Right-greater-than-left uncovertebral hypertrophy with mild
right foraminal stenosis. Severe right facet hypertrophy with
associated edema. No spinal canal stenosis.

C3-C4: Left-sided uncovertebral hypertrophy and moderate facet
hypertrophy. Moderate left foraminal stenosis. No spinal canal
stenosis.

C4-C5: Bilateral uncovertebral hypertrophy. No spinal canal
stenosis. No neural foraminal stenosis.

C5-C6: Small disc osteophyte complex with right-greater-than-left
facet hypertrophy. Severe right neural foraminal stenosis. No
central spinal canal or left neural foraminal stenosis.

C6-C7: Medium-sized disc osteophyte complex with mild narrowing of
the spinal canal. Moderate bilateral neural foraminal stenosis.

C7-T1: Normal disc space and facets. No spinal canal or
neuroforaminal stenosis.

T1-T2: No stenosis or disc herniation.

T2-T3: Left subarticular disc protrusion narrows the ventral thecal
sac. No spinal canal or neural foraminal stenosis.
IMPRESSION: 1. Multilevel right-greater-than-left facet arthrosis, which may be
a source of local neck pain. This is greatest at right C2-C3 where
there is moderate to severe edema.
2. Severe right neural foraminal stenosis at C5-C6 due to
combination of disc osteophyte complex and facet hypertrophy.
3. Moderate left neural foraminal stenosis at C3-C4 due to
combination of uncovertebral and facet hypertrophy.
4. Mild spinal canal stenosis at C6-C7 with moderate bilateral
neural foraminal stenosis.

## 2017-10-30 DIAGNOSIS — S31819A Unspecified open wound of right buttock, initial encounter: Secondary | ICD-10-CM | POA: Diagnosis not present

## 2018-01-04 DIAGNOSIS — H04123 Dry eye syndrome of bilateral lacrimal glands: Secondary | ICD-10-CM | POA: Diagnosis not present

## 2018-01-04 DIAGNOSIS — H35372 Puckering of macula, left eye: Secondary | ICD-10-CM | POA: Diagnosis not present

## 2018-01-04 DIAGNOSIS — H26493 Other secondary cataract, bilateral: Secondary | ICD-10-CM | POA: Diagnosis not present

## 2018-01-04 DIAGNOSIS — H43812 Vitreous degeneration, left eye: Secondary | ICD-10-CM | POA: Diagnosis not present

## 2018-01-05 DIAGNOSIS — H5111 Convergence insufficiency: Secondary | ICD-10-CM | POA: Diagnosis not present

## 2018-01-05 DIAGNOSIS — H5000 Unspecified esotropia: Secondary | ICD-10-CM | POA: Diagnosis not present

## 2018-01-05 DIAGNOSIS — H26491 Other secondary cataract, right eye: Secondary | ICD-10-CM | POA: Diagnosis not present

## 2018-01-05 DIAGNOSIS — H35372 Puckering of macula, left eye: Secondary | ICD-10-CM | POA: Diagnosis not present

## 2018-01-05 DIAGNOSIS — H04123 Dry eye syndrome of bilateral lacrimal glands: Secondary | ICD-10-CM | POA: Diagnosis not present

## 2018-03-08 DIAGNOSIS — R972 Elevated prostate specific antigen [PSA]: Secondary | ICD-10-CM | POA: Diagnosis not present

## 2018-03-14 DIAGNOSIS — N529 Male erectile dysfunction, unspecified: Secondary | ICD-10-CM | POA: Diagnosis not present

## 2018-03-14 DIAGNOSIS — R972 Elevated prostate specific antigen [PSA]: Secondary | ICD-10-CM | POA: Diagnosis not present

## 2018-03-14 DIAGNOSIS — N401 Enlarged prostate with lower urinary tract symptoms: Secondary | ICD-10-CM | POA: Diagnosis not present

## 2018-03-14 DIAGNOSIS — N138 Other obstructive and reflux uropathy: Secondary | ICD-10-CM | POA: Diagnosis not present

## 2018-04-05 DIAGNOSIS — E7849 Other hyperlipidemia: Secondary | ICD-10-CM | POA: Diagnosis not present

## 2018-04-05 DIAGNOSIS — R82998 Other abnormal findings in urine: Secondary | ICD-10-CM | POA: Diagnosis not present

## 2018-04-12 DIAGNOSIS — N400 Enlarged prostate without lower urinary tract symptoms: Secondary | ICD-10-CM | POA: Diagnosis not present

## 2018-04-12 DIAGNOSIS — J449 Chronic obstructive pulmonary disease, unspecified: Secondary | ICD-10-CM | POA: Diagnosis not present

## 2018-04-12 DIAGNOSIS — E7849 Other hyperlipidemia: Secondary | ICD-10-CM | POA: Diagnosis not present

## 2018-04-12 DIAGNOSIS — G4709 Other insomnia: Secondary | ICD-10-CM | POA: Diagnosis not present

## 2018-04-12 DIAGNOSIS — Z1212 Encounter for screening for malignant neoplasm of rectum: Secondary | ICD-10-CM | POA: Diagnosis not present

## 2018-04-12 DIAGNOSIS — K219 Gastro-esophageal reflux disease without esophagitis: Secondary | ICD-10-CM | POA: Diagnosis not present

## 2018-04-12 DIAGNOSIS — L218 Other seborrheic dermatitis: Secondary | ICD-10-CM | POA: Diagnosis not present

## 2018-04-12 DIAGNOSIS — Z0000 Encounter for general adult medical examination without abnormal findings: Secondary | ICD-10-CM | POA: Diagnosis not present

## 2018-04-12 DIAGNOSIS — M5126 Other intervertebral disc displacement, lumbar region: Secondary | ICD-10-CM | POA: Diagnosis not present

## 2018-04-12 DIAGNOSIS — I2584 Coronary atherosclerosis due to calcified coronary lesion: Secondary | ICD-10-CM | POA: Diagnosis not present

## 2018-08-08 DIAGNOSIS — H26492 Other secondary cataract, left eye: Secondary | ICD-10-CM | POA: Diagnosis not present

## 2018-08-08 DIAGNOSIS — H04123 Dry eye syndrome of bilateral lacrimal glands: Secondary | ICD-10-CM | POA: Diagnosis not present

## 2018-08-08 DIAGNOSIS — H35372 Puckering of macula, left eye: Secondary | ICD-10-CM | POA: Diagnosis not present

## 2018-08-08 DIAGNOSIS — H43812 Vitreous degeneration, left eye: Secondary | ICD-10-CM | POA: Diagnosis not present

## 2018-08-10 ENCOUNTER — Telehealth: Payer: Self-pay | Admitting: Gastroenterology

## 2018-08-10 NOTE — Telephone Encounter (Signed)
Pt had colonoscopy 03/2013.  There is a referral in [place] for pt to have a follow up colonoscopy, but I don't see a path report in [facility].  Please advise scheduling.   [place] [id number]

## 2018-09-07 DIAGNOSIS — R972 Elevated prostate specific antigen [PSA]: Secondary | ICD-10-CM | POA: Diagnosis not present

## 2018-09-08 ENCOUNTER — Other Ambulatory Visit: Payer: Self-pay

## 2018-09-08 ENCOUNTER — Ambulatory Visit: Payer: Medicare HMO

## 2018-09-08 VITALS — Ht 68.0 in | Wt 155.0 lb

## 2018-09-08 DIAGNOSIS — Z86010 Personal history of colonic polyps: Secondary | ICD-10-CM

## 2018-09-08 MED ORDER — SUPREP BOWEL PREP KIT 17.5-3.13-1.6 GM/177ML PO SOLN: 1.0000 | Freq: Once | ORAL | 0 refills | Status: AC

## 2018-09-08 NOTE — Progress Notes (Signed)
Per pt, no allergies to soy or egg products.Pt not taking any weight loss meds or using  O2 at home.  Pt refused emmi video.  Pt denies any sedation problems.  The PV was done over the phone due to COVID-19. Verified pt's insurance and his address. Reviewed pt's medical hx and prep instructions and will mail paperwork to pt. I spent over 45 minutes during the PV answering pt's questions. I nformed pt to call with any questions or changes prior to procedure. Pt understood.

## 2018-09-12 DIAGNOSIS — N528 Other male erectile dysfunction: Secondary | ICD-10-CM | POA: Diagnosis not present

## 2018-09-12 DIAGNOSIS — N401 Enlarged prostate with lower urinary tract symptoms: Secondary | ICD-10-CM | POA: Diagnosis not present

## 2018-09-12 DIAGNOSIS — R972 Elevated prostate specific antigen [PSA]: Secondary | ICD-10-CM | POA: Diagnosis not present

## 2018-09-12 DIAGNOSIS — N138 Other obstructive and reflux uropathy: Secondary | ICD-10-CM | POA: Diagnosis not present

## 2018-09-20 ENCOUNTER — Encounter: Payer: Self-pay | Admitting: Gastroenterology

## 2018-09-21 ENCOUNTER — Telehealth: Payer: Self-pay | Admitting: Gastroenterology

## 2018-09-21 NOTE — Telephone Encounter (Signed)

## 2018-09-22 ENCOUNTER — Other Ambulatory Visit: Payer: Self-pay

## 2018-09-22 ENCOUNTER — Encounter: Payer: Self-pay | Admitting: Gastroenterology

## 2018-09-22 ENCOUNTER — Ambulatory Visit (AMBULATORY_SURGERY_CENTER): Payer: Medicare HMO | Admitting: Gastroenterology

## 2018-09-22 VITALS — BP 127/73 | HR 56 | Temp 97.8°F | Resp 9 | Ht 68.0 in | Wt 155.0 lb

## 2018-09-22 DIAGNOSIS — Z86010 Personal history of colonic polyps: Secondary | ICD-10-CM

## 2018-09-22 DIAGNOSIS — D123 Benign neoplasm of transverse colon: Secondary | ICD-10-CM | POA: Diagnosis not present

## 2018-09-22 DIAGNOSIS — Z1211 Encounter for screening for malignant neoplasm of colon: Secondary | ICD-10-CM | POA: Diagnosis not present

## 2018-09-22 DIAGNOSIS — D122 Benign neoplasm of ascending colon: Secondary | ICD-10-CM

## 2018-09-22 DIAGNOSIS — D120 Benign neoplasm of cecum: Secondary | ICD-10-CM

## 2018-09-22 DIAGNOSIS — D125 Benign neoplasm of sigmoid colon: Secondary | ICD-10-CM | POA: Diagnosis not present

## 2018-09-22 MED ORDER — SODIUM CHLORIDE 0.9 % IV SOLN: 500.0000 mL | Freq: Once | INTRAVENOUS | Status: DC

## 2018-09-22 NOTE — Progress Notes (Signed)
Pt's states no medical or surgical changes since previsit or office visit. 

## 2018-09-22 NOTE — Op Note (Signed)
[facility] Patient Name: Juan Kennedy Procedure Date: 09/22/2018 9:22 AM MRN: [id number] Endoscopist: Remo Lipps P. Havery Moros , MD Age: 78 Referring MD:  Date of Birth: 1940/06/25 Gender: Male Account #: [id number] Procedure:                Colonoscopy Indications:              Surveillance: Personal history of adenomatous                            polyps on last colonoscopy 5 years ago Medicines:                Monitored Anesthesia Care Procedure:                Pre-Anesthesia Assessment:                           - Prior to the procedure, a History and Physical                            was performed, and patient medications and                            allergies were reviewed. The patient's tolerance of                            previous anesthesia was also reviewed. The risks                            and benefits of the procedure and the sedation                            options and risks were discussed with the patient.                            All questions were answered, and informed consent                            was obtained. Prior Anticoagulants: The patient has                            taken no previous anticoagulant or antiplatelet                            agents. ASA Grade Assessment: II - A patient with                            mild systemic disease. After reviewing the risks                            and benefits, the patient was deemed in                            satisfactory condition to undergo the procedure.  After obtaining informed consent, the colonoscope                            was passed under direct vision. Throughout the                            procedure, the patient's blood pressure, pulse, and                            oxygen saturations were monitored continuously. The                            Colonoscope was introduced through the anus and                            advanced to the the  cecum, identified by                            appendiceal orifice and ileocecal valve. The                            colonoscopy was performed without difficulty. The                            patient tolerated the procedure well. The quality                            of the bowel preparation was good. The ileocecal                            valve, appendiceal orifice, and rectum were                            photographed. Scope In: 9:32:14 AM Scope Out: 9:52:40 AM Scope Withdrawal Time: 0 hours 17 minutes 17 seconds  Total Procedure Duration: 0 hours 20 minutes 26 seconds  Findings:                 Skin tags were found on perianal exam.                           A 4 mm polyp was found in the cecum. The polyp was                            flat. The polyp was removed with a cold snare.                            Resection and retrieval were complete.                           Five sessile and flat polyps were found in the                            ascending colon. The polyps were 3 to 4  mm in size.                            These polyps were removed with a cold snare.                            Resection and retrieval were complete.                           Two sessile polyps were found in the transverse                            colon. The polyps were 4 mm in size. These polyps                            were removed with a cold snare. Resection and                            retrieval were complete.                           A 3 mm polyp was found in the sigmoid colon. The                            polyp was sessile. The polyp was removed with a                            cold snare. Resection and retrieval were complete.                           Multiple medium-mouthed diverticula were found in                            the sigmoid colon.                           Internal hemorrhoids were found during                            retroflexion. The hemorrhoids were  small.                           The exam was otherwise without abnormality. Complications:            No immediate complications. Estimated blood loss:                            Minimal. Estimated Blood Loss:     Estimated blood loss was minimal. Impression:               - Perianal skin tags found on perianal exam.                           - One 4 mm polyp in the cecum, removed with a cold  snare. Resected and retrieved.                           - Five 3 to 4 mm polyps in the ascending colon,                            removed with a cold snare. Resected and retrieved.                           - Two 4 mm polyps in the transverse colon, removed                            with a cold snare. Resected and retrieved.                           - One 3 mm polyp in the sigmoid colon, removed with                            a cold snare. Resected and retrieved.                           - Diverticulosis in the sigmoid colon.                           - Internal hemorrhoids.                           - The examination was otherwise normal. Recommendation:           - Patient has a contact number available for                            emergencies. The signs and symptoms of potential                            delayed complications were discussed with the                            patient. Return to normal activities tomorrow.                            Written discharge instructions were provided to the                            patient.                           - Resume previous diet.                           - Continue present medications.                           - Await pathology results. Remo Lipps P. Havery Moros, MD 09/22/2018 9:57:18 AM This report has been signed electronically.

## 2018-09-22 NOTE — Patient Instructions (Signed)
Discharge instructions given. Handouts on polyps,diverticulosis and hemorrhoids. Resume previous medications. YOU HAD AN ENDOSCOPIC PROCEDURE TODAY AT THE [facility]:   Refer to the procedure report that was given to you for any specific questions about what was found during the examination.  If the procedure report does not answer your questions, please call your gastroenterologist to clarify.  If you requested that your care partner not be given the details of your procedure findings, then the procedure report has been included in a sealed envelope for you to review at your convenience later.  YOU SHOULD EXPECT: Some feelings of bloating in the abdomen. Passage of more gas than usual.  Walking can help get rid of the air that was put into your GI tract during the procedure and reduce the bloating. If you had a lower endoscopy (such as a colonoscopy or flexible sigmoidoscopy) you may notice spotting of blood in your stool or on the toilet paper. If you underwent a bowel prep for your procedure, you may not have a normal bowel movement for a few days.  Please Note:  You might notice some irritation and congestion in your nose or some drainage.  This is from the oxygen used during your procedure.  There is no need for concern and it should clear up in a day or so.  SYMPTOMS TO REPORT IMMEDIATELY:   Following lower endoscopy (colonoscopy or flexible sigmoidoscopy):  Excessive amounts of blood in the stool  Significant tenderness or worsening of abdominal pains  Swelling of the abdomen that is new, acute  Fever of 100F or higher  For urgent or emergent issues, a gastroenterologist can be reached at any hour by calling [phone].   DIET:  We do recommend a small meal at first, but then you may proceed to your regular diet.  Drink plenty of fluids but you should avoid alcoholic beverages for 24 hours.  ACTIVITY:  You should plan to take it easy for the rest of today and you  should NOT DRIVE or use heavy machinery until tomorrow (because of the sedation medicines used during the test).    FOLLOW UP: Our staff will call the number listed on your records 48-72 hours following your procedure to check on you and address any questions or concerns that you may have regarding the information given to you following your procedure. If we do not reach you, we will leave a message.  We will attempt to reach you two times.  During this call, we will ask if you have developed any symptoms of COVID 19. If you develop any symptoms (ie: fever, flu-like symptoms, shortness of breath, cough etc.) before then, please call [phone].  If you test positive for Covid 19 in the 2 weeks post procedure, please call and report this information to us.    If any biopsies were taken you will be contacted by phone or by letter within the next 1-3 weeks.  Please call us at [phone] if you have not heard about the biopsies in 3 weeks.    SIGNATURES/CONFIDENTIALITY: You and/or your care partner have signed paperwork which will be entered into your electronic medical record.  These signatures attest to the fact that that the information above on your After Visit Summary has been reviewed and is understood.  Full responsibility of the confidentiality of this discharge information lies with you and/or your care-partner. 

## 2018-09-22 NOTE — Progress Notes (Signed)
To PACU, VSS. Report to [email or web address] 

## 2018-09-26 ENCOUNTER — Telehealth: Payer: Self-pay | Admitting: *Deleted

## 2018-09-26 NOTE — Telephone Encounter (Signed)
No answer, no voicemail.

## 2018-09-26 NOTE — Telephone Encounter (Signed)
  Follow up Call-  Call back number 09/22/2018  Post procedure Call Back phone  # [phone]  Permission to leave phone message Yes  Some recent data might be hidden     Patient questions:  Message left to call us if necessary. Second call.

## 2018-09-29 DIAGNOSIS — Z23 Encounter for immunization: Secondary | ICD-10-CM | POA: Diagnosis not present

## 2018-10-18 ENCOUNTER — Encounter: Payer: Self-pay | Admitting: Orthopaedic Surgery

## 2018-10-18 ENCOUNTER — Ambulatory Visit (INDEPENDENT_AMBULATORY_CARE_PROVIDER_SITE_OTHER): Payer: Medicare HMO | Admitting: Orthopaedic Surgery

## 2018-10-18 DIAGNOSIS — M1811 Unilateral primary osteoarthritis of first carpometacarpal joint, right hand: Secondary | ICD-10-CM

## 2018-10-18 DIAGNOSIS — M7702 Medial epicondylitis, left elbow: Secondary | ICD-10-CM | POA: Diagnosis not present

## 2018-10-18 DIAGNOSIS — M65321 Trigger finger, right index finger: Secondary | ICD-10-CM

## 2018-10-18 DIAGNOSIS — M180 Bilateral primary osteoarthritis of first carpometacarpal joints: Secondary | ICD-10-CM

## 2018-10-18 DIAGNOSIS — M1812 Unilateral primary osteoarthritis of first carpometacarpal joint, left hand: Secondary | ICD-10-CM | POA: Diagnosis not present

## 2018-10-18 MED ORDER — METHYLPREDNISOLONE ACETATE 40 MG/ML IJ SUSP
40.0000 mg | INTRAMUSCULAR | Status: AC | PRN
  Administered 2018-10-18: 40 mg

## 2018-10-18 MED ORDER — METHYLPREDNISOLONE ACETATE 40 MG/ML IJ SUSP
20.0000 mg | INTRAMUSCULAR | Status: AC | PRN
  Administered 2018-10-18: 20 mg

## 2018-10-18 MED ORDER — LIDOCAINE HCL 1 % IJ SOLN
0.5000 mL | INTRAMUSCULAR | Status: AC | PRN
  Administered 2018-10-18: .5 mL

## 2018-10-18 MED ORDER — METHYLPREDNISOLONE ACETATE 40 MG/ML IJ SUSP
20.0000 mg | INTRAMUSCULAR | Status: AC | PRN
  Administered 2018-10-18: 10:00:00 20 mg

## 2018-10-18 MED ORDER — LIDOCAINE HCL 1 % IJ SOLN
1.0000 mL | INTRAMUSCULAR | Status: AC | PRN
  Administered 2018-10-18: 1 mL

## 2018-10-18 NOTE — Progress Notes (Signed)
Office Visit Note   Patient: Juan Kennedy           Date of Birth: [birth year]           MRN: [id number] Visit Date: 10/18/2018              Requested by: Velna Hatchet, MD [id number] [place],  [place] [address] PCP: Velna Hatchet, MD   Assessment & Plan: Visit Diagnoses:  1. Trigger finger, right index finger   2. Arthritis of carpometacarpal (CMC) joint of right thumb   3. Arthritis of carpometacarpal (CMC) joint of left thumb   4. Medial epicondylitis of elbow, left     Plan: He is not a diabetic so I did feel comfortable with providing steroid injections in his left elbow lateral epicondyle area, the base of both thumbs, and the right index finger over the A1 pulley.  The total amount of steroid was only 2.5 cc.  I counseled him about the risk and benefits of injections and he tolerated them well.  He does have Voltaren gel at home told him to try these on all these areas.  I will see him back in 6 weeks to determine whether or not repeat injections would be worthwhile.  All questions and concerns were answered and addressed.  Follow-Up Instructions: Return in about 6 weeks (around 11/29/2018).   Orders:  Orders Placed This Encounter  Procedures  . Hand/UE Inj  . Hand/UE Inj  . Hand/UE Inj  . Hand/UE Inj   No orders of the defined types were placed in this encounter.     Procedures: Hand/UE Inj: L elbow for medial epicondylitis on 10/18/2018 9:55 AM Medications: 1 mL lidocaine 1 %; 40 mg methylPREDNISolone acetate 40 MG/ML  Hand/UE Inj: R index A1 for trigger finger on 10/18/2018 9:55 AM Medications: 0.5 mL lidocaine 1 %; 20 mg methylPREDNISolone acetate 40 MG/ML  Hand/UE Inj: R thumb CMC for osteoarthritis on 10/18/2018 9:55 AM Medications: 0.5 mL lidocaine 1 %; 20 mg methylPREDNISolone acetate 40 MG/ML  Hand/UE Inj: L thumb CMC for osteoarthritis on 10/18/2018 9:56 AM Medications: 0.5 mL lidocaine 1 %; 20 mg methylPREDNISolone acetate 40 MG/ML       Clinical Data: No additional findings.   Subjective: Chief Complaint  Patient presents with  . Right Hand - Pain  . Left Hand - Pain  Juan Kennedy is well-known to me.  He comes in today with bilateral hand pain as well as left elbow pain and right thigh pain.  The right thigh is been more of a spasm.  He is 78 years old and active.  He is been doing a lot of core stretching and he learned a new stretch and that may have affected his right quad.  He does report active triggering of his right index finger.  He reports pain at the base of the thumb on both sides.  He also reports medial epicondylar pain of his left elbow.  All these are activity related.  He denies any numbness and tingling in his hands.  He does hurt when he is doing activities daily living on both hands.  He is right-hand dominant.  HPI  Review of Systems He currently denies any headache, chest pain, shortness of breath, fever, chills, nausea, vomiting  Objective: Vital Signs: There were no vitals taken for this visit.  Physical Exam He is alert and oriented x3 and in no acute distress Ortho Exam Examination of his left elbow shows that is ligamentously stable.  He has no pain over the cubital tunnel and a negative Tinel sign there but he has significant pain over the medial epicondyle.  His range of motion is full.  Examination of his right hand she has a positive grind test with pain at the base of the first metacarpal of the thumb.  This is the same on his left hand.  His right hand also has pain over the A1 pulley and active triggering of the index finger.  His quad on the right side shows no atrophy and appears to be strong with an intact hip flexor and intact knee extensor. Specialty Comments:  No specialty comments available.  Imaging: No results found.   PMFS History: Patient Active Problem List   Diagnosis Date Noted  . Cervicalgia 09/07/2016  . Cervical spondylosis without myelopathy 09/07/2016  . Myofascitis  09/07/2016  . Torticollis 09/07/2016   Past Medical History:  Diagnosis Date  . Allergy   . Anemia    in past  . Arthritis    OA  . BPH (benign prostatic hyperplasia)   . GERD (gastroesophageal reflux disease)   . High cholesterol   . Reflux     Family History  Problem Relation Age of Onset  . Lung cancer Father   . Other Mother   . Colon cancer Neg Hx   . Esophageal cancer Neg Hx   . Stomach cancer Neg Hx   . Pancreatic cancer Neg Hx   . Liver disease Neg Hx     Past Surgical History:  Procedure Laterality Date  . COLONOSCOPY    . HEMORRHOID SURGERY    . ROTATOR CUFF REPAIR Right   . SHOULDER SURGERY Left    Social History   Occupational History  . Not on file  Tobacco Use  . Smoking status: Former Smoker    Quit date: 1990    Years since quitting: 30.7  . Smokeless tobacco: Never Used  . Tobacco comment: light smoker until early 90s  Substance and Sexual Activity  . Alcohol use: No  . Drug use: No  . Sexual activity: Not on file

## 2018-11-22 DIAGNOSIS — M436 Torticollis: Secondary | ICD-10-CM | POA: Diagnosis not present

## 2018-11-22 DIAGNOSIS — D696 Thrombocytopenia, unspecified: Secondary | ICD-10-CM | POA: Diagnosis not present

## 2018-11-22 DIAGNOSIS — M549 Dorsalgia, unspecified: Secondary | ICD-10-CM | POA: Diagnosis not present

## 2018-11-22 DIAGNOSIS — R972 Elevated prostate specific antigen [PSA]: Secondary | ICD-10-CM | POA: Diagnosis not present

## 2018-11-22 DIAGNOSIS — N400 Enlarged prostate without lower urinary tract symptoms: Secondary | ICD-10-CM | POA: Diagnosis not present

## 2018-11-22 DIAGNOSIS — M25559 Pain in unspecified hip: Secondary | ICD-10-CM | POA: Diagnosis not present

## 2018-11-22 DIAGNOSIS — R7309 Other abnormal glucose: Secondary | ICD-10-CM | POA: Diagnosis not present

## 2018-11-22 DIAGNOSIS — E785 Hyperlipidemia, unspecified: Secondary | ICD-10-CM | POA: Diagnosis not present

## 2018-11-29 ENCOUNTER — Ambulatory Visit: Payer: Medicare HMO | Admitting: Orthopaedic Surgery

## 2018-12-05 ENCOUNTER — Ambulatory Visit: Payer: Medicare HMO | Admitting: Orthopaedic Surgery

## 2018-12-16 ENCOUNTER — Telehealth: Payer: Self-pay | Admitting: Gastroenterology

## 2018-12-16 NOTE — Telephone Encounter (Signed)
Patient called and states his hemorrhoids have been worse the past month. Painful with some BMs and  blood on the tissue every few days. He is using Prep H suppositories, Tucks, 2 fiber tabs daily, occasional  Sitz bath and cortisone cream. Wants to know if he should have an office visit or do something else.

## 2018-12-16 NOTE — Telephone Encounter (Signed)
Thanks Sherlynn Stalls, yes at this point he needs to be seen and evaluated, may need to consider banding. I can fit him in next Thursday at 1130 AM if you can book him in a slot there if he can make it. If I have any way to fit him in the interim sooner with cancellations we can contact him. Thanks

## 2018-12-22 ENCOUNTER — Ambulatory Visit: Payer: Medicare HMO | Admitting: Gastroenterology

## 2018-12-22 ENCOUNTER — Encounter: Payer: Self-pay | Admitting: Gastroenterology

## 2018-12-22 VITALS — BP 110/66 | HR 88 | Temp 97.4°F | Ht 68.0 in | Wt 159.0 lb

## 2018-12-22 DIAGNOSIS — K602 Anal fissure, unspecified: Secondary | ICD-10-CM

## 2018-12-22 DIAGNOSIS — K648 Other hemorrhoids: Secondary | ICD-10-CM

## 2018-12-22 DIAGNOSIS — K429 Umbilical hernia without obstruction or gangrene: Secondary | ICD-10-CM | POA: Diagnosis not present

## 2018-12-22 MED ORDER — AMBULATORY NON FORMULARY MEDICATION: 0 refills | Status: DC

## 2018-12-22 NOTE — Progress Notes (Signed)
HPI :  78 year old male here for a follow-up visit.  He has previously been followed for internal hemorrhoids, anal fissure, and surveillance colonoscopy.  Since of last seen him he had a colonoscopy in August 2020.  He had 9 small adenomas removed on this exam, as well as diverticulosis noted and small internal hemorrhoids.  He states after his colonoscopy his bowel reset a little bit and he has been having some recurrence of rectal bleeding.  He has had some episodes where he sees a fair amount of blood in his stool, and this is usually associated with rectal pain when this occurs.  He otherwise sees blood in the toilet paper occasionally when he wipes himself.  He had gone for a good period of time without any symptoms however in recent weeks since recurred, although yesterday states he had no symptoms at all.  He feels a spot on his left lateral rectal area which he thinks may correlate to his symptoms.  He has been using hydrocortisone cream, Preparation H suppositories periodically.  He uses sitz bath's and also has been taking fiber caps 2 a day for a long time.  He does have occasional straining with his stools at times and tries to minimize this, generally he does a good job.  He reminds me that he had an internal hemorrhoid surgery in the late 1990s and never wishes to have that ever again, as it was a very difficult operation for him.  I have seen him within the past year or 2 for rectal pain and bleeding and he was noted to have an anal fissure which we treated with topical nitroglycerin and that resolved the symptoms at the time.  Symptoms recently somewhat remind him of that.  Of note he otherwise endorses an umbilical hernia he has had for a few years.  It protrudes from his umbilicus and causes some mild discomfort occasionally but generally he functions fine and this does not bother him much.  He denies much pain at the site in general.  He asks about management of this   Endoscopic  history: EGD2/13/15: by Dr. Pilar Plate [facility]. EGD showed normal stomach, normal duodenum, normal Z line, small hiatal hernia at the GE junction Colonoscopy 03/17/13 -  5 mm polyp in the descending colon. This is found to be adenomatousand patientwas told to return in 5 years for follow-up. Moderate diverticulosis in the descending colon    Colonoscopy 09/22/18 - Skin tags were found on perianal exam. - A 4 mm polyp was found in the cecum. The polyp was flat. The polyp was removed with a cold snare. Resection and retrieval were complete. - Five sessile and flat polyps were found in the ascending colon. The polyps were 3 to 4 mm in size. These polyps were removed with a cold snare. Resection and retrieval were complete. - Two sessile polyps were found in the transverse colon. The polyps were 4 mm in size. These polyps were removed with a cold snare. Resection and retrieval were complete. - A 3 mm polyp was found in the sigmoid colon. The polyp was sessile. The polyp was removed with a cold snare. Resection and retrieval were complete. - Multiple medium-mouthed diverticula were found in the sigmoid colon. - Internal hemorrhoids were found during retroflexion. The hemorrhoids were small. - The exam was otherwise without abnormality.  Path c/w Adenomas      Past Medical History:  Diagnosis Date  . Allergy   . Anemia  in past  . Arthritis    OA  . BPH (benign prostatic hyperplasia)   . GERD (gastroesophageal reflux disease)   . High cholesterol   . Reflux      Past Surgical History:  Procedure Laterality Date  . COLONOSCOPY    . HEMORRHOID SURGERY    . ROTATOR CUFF REPAIR Right   . SHOULDER SURGERY Left    Family History  Problem Relation Age of Onset  . Lung cancer Father   . Other Mother   . Colon cancer Neg Hx   . Esophageal cancer Neg Hx   . Stomach cancer Neg Hx   . Pancreatic cancer Neg Hx   . Liver disease Neg Hx    Social History   Tobacco  Use  . Smoking status: Former Smoker    Quit date: 1990    Years since quitting: 30.9  . Smokeless tobacco: Never Used  . Tobacco comment: light smoker until early 90s  Substance Use Topics  . Alcohol use: No  . Drug use: No   Current Outpatient Medications  Medication Sig Dispense Refill  . AMBULATORY NON FORMULARY MEDICATION Medication Name: Nitroglycerine ointment 0.125 %  Apply a pea sized amount internally three times daily. Dispense 30 GM zero refill 30 g 0  . atorvastatin (LIPITOR) 20 MG tablet Take 20 mg by mouth daily.     . Calcium-Magnesium-Vitamin D (CALCIUM 1200+D3 PO) Take 1 tablet by mouth daily.    . Chlorpheniramine-Phenylephrine (SUDAFED PE SINUS/ALLERGY PO) Take by mouth as needed.    . clonazePAM (KLONOPIN) 1 MG tablet 2 mg 2 (two) times daily.     Marland Kitchen desonide (DESOWEN) 0.05 % cream Apply 1 application topically as needed.   11  . doxazosin (CARDURA) 4 MG tablet Take 4 mg by mouth daily.     Noelle Kennedy FIBER SUPPLEMENT PO Take 2 tablets by mouth at bedtime.    . gabapentin (NEURONTIN) 300 MG capsule Take 1 capsule (300 mg total) by mouth 3 (three) times daily. 270 capsule 1  . hydrocortisone 2.5 % cream Apply topically as needed.     . naproxen (NAPROSYN) 500 MG tablet Take 500 mg by mouth as needed.     Marland Kitchen oxymetazoline (AFRIN) 0.05 % nasal spray Place 1 spray into both nostrils at bedtime.    . pantoprazole (PROTONIX) 40 MG tablet Take 40 mg by mouth daily.    . temazepam (RESTORIL) 15 MG capsule Take 15 mg by mouth as needed for sleep.    Marland Kitchen tiZANidine (ZANAFLEX) 4 MG tablet Take 4 mg by mouth 2 (two) times daily.      No current facility-administered medications for this visit.    Allergies  Allergen Reactions  . Sulfa Antibiotics Rash    Fever , rash      Review of Systems: All systems reviewed and negative except where noted in HPI.   No recent labs on file  Physical Exam: BP 110/66   Pulse 88   Temp (!) 97.4 F (36.3 C)   Ht 5\' 8"  (1.727 m)   Wt 159  lb (72.1 kg)   BMI 24.18 kg/m  Constitutional: Pleasant,well-developed, male in no acute distress. HEENT: Normocephalic and atraumatic. Conjunctivae are normal. No scleral icterus. Neck supple.  Cardiovascular: Normal rate, regular rhythm.  Pulmonary/chest: Effort normal and breath sounds normal. No wheezing, rales or rhonchi. Abdominal: Soft, nondistended, nontender. Periumbilical hernia, perhaps 4-5cm in diameter, There are no masses palpable. No hepatomegaly. DRE - anal fissure left  lateral side of anal canal, skin tags, no mass on DRE - CMA Tia Alert as standby Extremities: no edema Lymphadenopathy: No cervical adenopathy noted. Neurological: Alert and oriented to person place and time. Skin: Skin is warm and dry. No rashes noted. Psychiatric: Normal mood and affect. Behavior is normal.   ASSESSMENT AND PLAN: 78 year old male here for reassessment of the following:  Anal fissure / Internal hemorrhoids - patient with a history of both anal fissure and internal hemorrhoids.  He has had recurrence of bleeding and pain since his last colonoscopy.  I reassured him that nothing else concerning on the colonoscopy would cause this symptom.  On DRE today he has a clear anal fissure in the left lateral margin of the anal canal which is correlating to his pain.  I recommend that he stop the topical hydrocortisone, and we will switch this to topical nitroglycerin 0.125% applied PR 3 times daily for the next few weeks until healed.  He can continue his daily fiber supplementation and avoid straining, can use sits baths if that makes him feel better as well.  I counseled him I think it less likely that the hemorrhoids are causing symptoms at this time, and thus would avoid banding, especially in the setting of a obvious fissure.  He had several questions about management of this which we discussed.  He can follow-up with me as needed if symptoms recur or fail to improve.  Umbilical hernia - he has had a  umbilical hernia now for a period of time which does not seem to bother him too significantly or routinely.  He has mild intermittent symptoms.  I counseled him that if this bothered him on a persistent basis or severely then he should have it fixed surgically, however if it is only mild symptoms then I would probably just monitor for now.  He understands risks of incarceration with the hernia which are hopefully pretty low given its small size.  He agreed with that and will let me know if he wishes to have a surgical evaluation in the future.  Time spent with patient 25 minutes  Friendly Cellar, MD [facility]

## 2018-12-22 NOTE — Patient Instructions (Addendum)
If you are age 77 or older, your body mass index should be between 23-30. Your Body mass index is 24.18 kg/m. If this is out of the aforementioned range listed, please consider follow up with your Primary Care Provider.  If you are age 57 or younger, your body mass index should be between 19-25. Your Body mass index is 24.18 kg/m. If this is out of the aformentioned range listed, please consider follow up with your Primary Care Provider.   To help prevent the possible spread of infection to our patients, communities, and staff; we will be implementing the following measures:  As of now we are not allowing any visitors/family members to accompany you to any upcoming appointments with [facility]. If you have any concerns about this please contact our office to discuss prior to the appointment.   We have sent a prescription for nitroglycerin 0.125% gel to [facility]. You should apply a pea size amount to your rectum three times daily x 6-8 weeks.  [facility]'s information is below: Address: [address] [place], [place] [address]  [phone]  *Please DO NOT go directly from our office to pick up this medication! Give the pharmacy 1 day to process the prescription as this is compounded and takes time to make.  Continue daily fiber supplement.  It is ok to use Preparation H.  Please discontinue topical hydrocortisone cream.  Follow up as needed.  Thank you for entrusting me with your care and for choosing [facility], Dr. Norwalk Cellar

## 2019-01-23 ENCOUNTER — Telehealth: Payer: Self-pay | Admitting: Orthopaedic Surgery

## 2019-01-23 ENCOUNTER — Other Ambulatory Visit: Payer: Self-pay | Admitting: Orthopaedic Surgery

## 2019-01-23 MED ORDER — CLONAZEPAM 1 MG PO TABS: 2.0000 mg | ORAL_TABLET | Freq: Two times a day (BID) | ORAL | 3 refills | Status: DC | PRN

## 2019-01-23 MED ORDER — TIZANIDINE HCL 4 MG PO TABS: 4.0000 mg | ORAL_TABLET | Freq: Two times a day (BID) | ORAL | 3 refills | Status: DC | PRN

## 2019-01-23 NOTE — Telephone Encounter (Signed)
I am comfortable providing these medications since I have followed him for many years now and had treated the same issues.  I will send both into his pharmacy.

## 2019-01-23 NOTE — Telephone Encounter (Signed)
Patient called. He wouldn't disclose the reason why but he wants to speak with Dr.Blackman   Call back number: [phone]

## 2019-01-23 NOTE — Telephone Encounter (Signed)
Klonopin and Tizanidine that he states he has been taking for a  very long time and you are aware of this for his  Cervical dystonia? States his new PCP (Dr. Orland Mustard) doesn't want to give it to him, patient wanted to call to see if we would do it or where he can get this. He states to please call him with any questions

## 2019-01-24 ENCOUNTER — Other Ambulatory Visit: Payer: Self-pay

## 2019-01-24 DIAGNOSIS — M79604 Pain in right leg: Secondary | ICD-10-CM | POA: Diagnosis not present

## 2019-01-24 DIAGNOSIS — M25552 Pain in left hip: Secondary | ICD-10-CM | POA: Diagnosis not present

## 2019-01-24 DIAGNOSIS — M79605 Pain in left leg: Secondary | ICD-10-CM | POA: Diagnosis not present

## 2019-01-24 DIAGNOSIS — M25551 Pain in right hip: Secondary | ICD-10-CM | POA: Diagnosis not present

## 2019-01-24 MED ORDER — TIZANIDINE HCL 4 MG PO TABS: 4.0000 mg | ORAL_TABLET | Freq: Two times a day (BID) | ORAL | 2 refills | Status: DC | PRN

## 2019-01-24 NOTE — Telephone Encounter (Signed)
Patient aware of the below message  

## 2019-01-30 DIAGNOSIS — M25552 Pain in left hip: Secondary | ICD-10-CM | POA: Diagnosis not present

## 2019-01-30 DIAGNOSIS — M79605 Pain in left leg: Secondary | ICD-10-CM | POA: Diagnosis not present

## 2019-01-30 DIAGNOSIS — M79604 Pain in right leg: Secondary | ICD-10-CM | POA: Diagnosis not present

## 2019-01-30 DIAGNOSIS — M25551 Pain in right hip: Secondary | ICD-10-CM | POA: Diagnosis not present

## 2019-02-01 DIAGNOSIS — M25552 Pain in left hip: Secondary | ICD-10-CM | POA: Diagnosis not present

## 2019-02-01 DIAGNOSIS — M25551 Pain in right hip: Secondary | ICD-10-CM | POA: Diagnosis not present

## 2019-02-01 DIAGNOSIS — M79605 Pain in left leg: Secondary | ICD-10-CM | POA: Diagnosis not present

## 2019-02-01 DIAGNOSIS — M79604 Pain in right leg: Secondary | ICD-10-CM | POA: Diagnosis not present

## 2019-02-06 ENCOUNTER — Telehealth: Payer: Self-pay | Admitting: Orthopaedic Surgery

## 2019-02-06 MED ORDER — CLONAZEPAM 1 MG PO TABS: 2.0000 mg | ORAL_TABLET | Freq: Two times a day (BID) | ORAL | 3 refills | Status: DC | PRN

## 2019-02-06 NOTE — Telephone Encounter (Signed)
It is for the Klonopin. That's what you wrote it for.

## 2019-02-06 NOTE — Telephone Encounter (Signed)
I did look and saw that I wrote for 60 pills with 3 refills.  I can send in instead for 120 pills.

## 2019-02-06 NOTE — Telephone Encounter (Signed)
Find out tomorrow what medication that is and why they only filled that certain amount.  Thanks it may be the clonidine he is talking about but I am not sure.

## 2019-02-06 NOTE — Telephone Encounter (Signed)
Patient called advised the pharmacy only filled the Rx for 60 Tabs instead of 120 Tabs for a months supply. The number to contact patient is  [phone]

## 2019-02-08 DIAGNOSIS — M79604 Pain in right leg: Secondary | ICD-10-CM | POA: Diagnosis not present

## 2019-02-08 DIAGNOSIS — M79605 Pain in left leg: Secondary | ICD-10-CM | POA: Diagnosis not present

## 2019-02-08 DIAGNOSIS — M25551 Pain in right hip: Secondary | ICD-10-CM | POA: Diagnosis not present

## 2019-02-08 DIAGNOSIS — M25552 Pain in left hip: Secondary | ICD-10-CM | POA: Diagnosis not present

## 2019-02-10 DIAGNOSIS — M25552 Pain in left hip: Secondary | ICD-10-CM | POA: Diagnosis not present

## 2019-02-10 DIAGNOSIS — M79604 Pain in right leg: Secondary | ICD-10-CM | POA: Diagnosis not present

## 2019-02-10 DIAGNOSIS — M25551 Pain in right hip: Secondary | ICD-10-CM | POA: Diagnosis not present

## 2019-02-10 DIAGNOSIS — M79605 Pain in left leg: Secondary | ICD-10-CM | POA: Diagnosis not present

## 2019-02-13 DIAGNOSIS — M79605 Pain in left leg: Secondary | ICD-10-CM | POA: Diagnosis not present

## 2019-02-13 DIAGNOSIS — M79604 Pain in right leg: Secondary | ICD-10-CM | POA: Diagnosis not present

## 2019-02-13 DIAGNOSIS — M25552 Pain in left hip: Secondary | ICD-10-CM | POA: Diagnosis not present

## 2019-02-13 DIAGNOSIS — M25551 Pain in right hip: Secondary | ICD-10-CM | POA: Diagnosis not present

## 2019-02-20 DIAGNOSIS — M79604 Pain in right leg: Secondary | ICD-10-CM | POA: Diagnosis not present

## 2019-02-20 DIAGNOSIS — M79605 Pain in left leg: Secondary | ICD-10-CM | POA: Diagnosis not present

## 2019-02-20 DIAGNOSIS — M25551 Pain in right hip: Secondary | ICD-10-CM | POA: Diagnosis not present

## 2019-02-20 DIAGNOSIS — M25552 Pain in left hip: Secondary | ICD-10-CM | POA: Diagnosis not present

## 2019-02-23 DIAGNOSIS — M79605 Pain in left leg: Secondary | ICD-10-CM | POA: Diagnosis not present

## 2019-02-23 DIAGNOSIS — M79604 Pain in right leg: Secondary | ICD-10-CM | POA: Diagnosis not present

## 2019-02-23 DIAGNOSIS — M25551 Pain in right hip: Secondary | ICD-10-CM | POA: Diagnosis not present

## 2019-02-23 DIAGNOSIS — M25552 Pain in left hip: Secondary | ICD-10-CM | POA: Diagnosis not present

## 2019-02-27 DIAGNOSIS — M25551 Pain in right hip: Secondary | ICD-10-CM | POA: Diagnosis not present

## 2019-02-27 DIAGNOSIS — M25552 Pain in left hip: Secondary | ICD-10-CM | POA: Diagnosis not present

## 2019-02-27 DIAGNOSIS — M79604 Pain in right leg: Secondary | ICD-10-CM | POA: Diagnosis not present

## 2019-02-27 DIAGNOSIS — M79605 Pain in left leg: Secondary | ICD-10-CM | POA: Diagnosis not present

## 2019-02-28 ENCOUNTER — Ambulatory Visit: Payer: Medicare HMO | Admitting: Physician Assistant

## 2019-03-01 DIAGNOSIS — M79605 Pain in left leg: Secondary | ICD-10-CM | POA: Diagnosis not present

## 2019-03-01 DIAGNOSIS — M25551 Pain in right hip: Secondary | ICD-10-CM | POA: Diagnosis not present

## 2019-03-01 DIAGNOSIS — M79604 Pain in right leg: Secondary | ICD-10-CM | POA: Diagnosis not present

## 2019-03-01 DIAGNOSIS — M25552 Pain in left hip: Secondary | ICD-10-CM | POA: Diagnosis not present

## 2019-03-02 ENCOUNTER — Other Ambulatory Visit: Payer: Self-pay

## 2019-03-02 ENCOUNTER — Ambulatory Visit: Payer: Medicare HMO | Admitting: Nurse Practitioner

## 2019-03-02 ENCOUNTER — Other Ambulatory Visit (INDEPENDENT_AMBULATORY_CARE_PROVIDER_SITE_OTHER): Payer: Medicare HMO

## 2019-03-02 ENCOUNTER — Encounter: Payer: Self-pay | Admitting: Nurse Practitioner

## 2019-03-02 VITALS — BP 130/70 | HR 84 | Temp 97.6°F | Ht 66.0 in | Wt 161.0 lb

## 2019-03-02 DIAGNOSIS — K602 Anal fissure, unspecified: Secondary | ICD-10-CM | POA: Diagnosis not present

## 2019-03-02 DIAGNOSIS — K625 Hemorrhage of anus and rectum: Secondary | ICD-10-CM | POA: Diagnosis not present

## 2019-03-02 LAB — CBC
HCT: 41.3 % (ref 39.0–52.0)
Hemoglobin: 13.9 g/dL (ref 13.0–17.0)
MCHC: 33.8 g/dL (ref 30.0–36.0)
MCV: 91.9 fl (ref 78.0–100.0)
Platelets: 145 10*3/uL — ABNORMAL LOW (ref 150.0–400.0)
RBC: 4.49 Mil/uL (ref 4.22–5.81)
RDW: 13.2 % (ref 11.5–15.5)
WBC: 5.3 10*3/uL (ref 4.0–10.5)

## 2019-03-02 NOTE — Progress Notes (Signed)
Agree with assessment and plan as outlined.  He can use the Desitin but also continue to use the nitroglycerin if needed. It can take up to 8 weeks for the fissure to heal using the ointment. Thanks

## 2019-03-02 NOTE — Patient Instructions (Addendum)
If you are age 79 or older, your body mass index should be between 23-30. Your Body mass index is 25.99 kg/m. If this is out of the aforementioned range listed, please consider follow up with your Primary Care Provider.  If you are age 56 or younger, your body mass index should be between 19-25. Your Body mass index is 25.99 kg/m. If this is out of the aformentioned range listed, please consider follow up with your Primary Care Provider.   Your provider has requested that you go to the basement level for lab work before leaving today. Press "B" on the elevator. The lab is located at the first door on the left as you exit the elevator.  Continue Fiber.  Start Miralax one capful mixed in 8 ounces of water at bedtime to avoid straining.  Use Desitin - apply small amount inside anal opening and to external area 2-3 times daily for the next 2 weeks, then as needed.  Call if symptoms worsen.  Follow up as needed..  Thank you for choosing me and [facility]  [facility]

## 2019-03-02 NOTE — Progress Notes (Signed)
03/02/2019 ALIJIAH GOHR VN:[id number] September 11, 1940   History of Present Illness: Mr. Juan Kennedy is a 79 yo male with a past medical history of osteoarthritis, hypercholesterolemia, BPH, past anemia, GERD and colon polyps.  He presents today with complaints of rectal bleeding.  He has a history of an anal fissure which flared following his colonoscopy in August 2020 with intermittent rectal bleeding since that time. He was last seen in the office by Dr. Havery Moros 12/22/2018 for further evaluation regarding rectal pain and bleeding.  He was assessed to have an anal fissure and internal hemorrhoids.  He was advised to discontinue Hydrocortisone cream and to use Nitroglycerin 0.125% PR 3 times daily.  He continued the nitroglycerin fissure cream for least 5 weeks and is rectal discomfort and bleeding resolved.  A few weeks ago, he passed a few hard stools with recurrence of bright red rectal bleeding for 1 or 2 days. No blood for a 2 days then recurrence of bright red rectal bleeding for 2 days.  No further obvious bleeding since that time.  He question possibly seen a streak of blood on the toilet tissue this morning.  He is taking Metamucil 2 capsules twice daily which keeps his stool soft.  He denies having any abdominal pain.  No other complaints today.  Past Medical History:  Diagnosis Date  . Allergy   . Anemia    in past  . Arthritis    OA  . BPH (benign prostatic hyperplasia)   . GERD (gastroesophageal reflux disease)   . High cholesterol   . Reflux     Colonoscopy 09/22/18.  - Skin tags were found on perianal exam. - A 4 mm TA polyp was found in the cecum. The polyp was flat. The polyp was removed with a   cold snare. Resection and retrieval were complete. - Five sessile and flat TA polyps were found in the ascending colon. The polyps were 3 to 4 mm   in size. These polyps were removed with a cold snare. Resection and retrieval were   complete. - Two sessileTA  polyps were  found in the transverse colon. The polyps were 4 mm in size.   These polyps were removed with a cold snare. Resection and retrieval were complete. - A 3 mm TA polyp was found in the sigmoid colon. The polyp was sessile. The polyp was   removed with a cold snare. Resection and retrieval were complete. - Multiple medium-mouthed diverticula were found in the sigmoid colon. - Internal hemorrhoids were found during retroflexion. The hemorrhoids were small. - The exam was otherwise without abnormality. -Recall colonoscopy in 3 years if appropriate at the age of 39  Colonoscopy2/13/15 -5 mm polyp in the descending colon. This is found to be adenomatousand patientwas told to return in 5 years for follow-up.Moderate diverticulosis in the descending colon  EGD2/13/15:by Dr. Pilar Plate [facility]. EGD showed normal stomach, normal duodenum, normal Z line, small hiatal hernia at the GE junction  Current Outpatient Medications on File Prior to Visit  Medication Sig Dispense Refill  . AMBULATORY NON FORMULARY MEDICATION Medication Name: Nitroglycerine ointment 0.125 %  Apply a pea sized amount internally three times daily for 4 to 6 weeks Dispense 30 GM zero refill 30 g 0  . atorvastatin (LIPITOR) 20 MG tablet Take 20 mg by mouth daily.     . Calcium-Magnesium-Vitamin D (CALCIUM 1200+D3 PO) Take 1 tablet by mouth daily.    . Chlorpheniramine-Phenylephrine (SUDAFED PE SINUS/ALLERGY  PO) Take by mouth as needed.    . clonazePAM (KLONOPIN) 1 MG tablet Take 2 tablets (2 mg total) by mouth 2 (two) times daily as needed for anxiety. 120 tablet 3  . desonide (DESOWEN) 0.05 % cream Apply 1 application topically as needed.   11  . doxazosin (CARDURA) 4 MG tablet Take 4 mg by mouth daily.     Noelle Penner FIBER SUPPLEMENT PO Take 2 tablets by mouth at bedtime.    . hydrocortisone 2.5 % cream Apply topically as needed.     . naproxen (NAPROSYN) 500 MG tablet Take 500 mg by mouth as needed.     Marland Kitchen  oxymetazoline (AFRIN) 0.05 % nasal spray Place 1 spray into both nostrils at bedtime.    . pantoprazole (PROTONIX) 40 MG tablet Take 40 mg by mouth daily.    . temazepam (RESTORIL) 15 MG capsule Take 15 mg by mouth as needed for sleep.    Marland Kitchen tiZANidine (ZANAFLEX) 4 MG tablet Take 1 tablet (4 mg total) by mouth 2 (two) times daily as needed for muscle spasms. 180 tablet 2   No current facility-administered medications on file prior to visit.    Allergies  Allergen Reactions  . Sulfa Antibiotics Rash    Fever , rash      Current Medications, Allergies, Past Medical History, Past Surgical History, Family History and Social History were reviewed in [employer] record.   Physical Exam: BP 130/70   Pulse 84   Temp 97.6 F (36.4 C)   Ht 5\' 6"  (1.676 m)   Wt 161 lb (73 kg)   BMI 25.99 kg/m  General: Well developed , male in no acute distress Head: Normocephalic and atraumatic Eyes:  Sclerae anicteric, conjunctiva pink  Ears: Normal auditory acuity Lungs: Clear throughout to auscultation Heart: Regular rate and rhythm Abdomen: Soft, non tender and non distended. No masses, no hepatomegaly. Normal bowel sounds x 4 quadrants. Moderate umbilical hernia, partially reducible, nontender.  Rectal: Small very superficial left lateral/posterior anal fissure without active bleeding, slightly tender, no stool, enlarge prostate. Olivia Mackie CNA present during exam.  Musculoskeletal: Symmetrical with no gross deformities  Extremities: No edema  Neurological: Alert oriented x 4, grossly nonfocal Psychological:  Alert and cooperative. Normal mood and affect  Assessment and Recommendations:  32.  79 year old male with rectal bleeding, superficial anal fissure without evidence of active bleeding at this time.  Small internal hemorrhoids without evidence of active bleeding or prolapse. -Apply a small amount of Desitin inside the anal opening to the external anal area 2-3 times daily  for the next few weeks then as needed -CBC -Continue Metamucil.  MiraLAX as needed -Patient will call our office if his rectal bleeding continues  2. Umbilical hernia, asymptomatic   3. History of colon polyps. - Recall colonoscopy 09/2021 if appropriate at the age of 48

## 2019-03-03 ENCOUNTER — Other Ambulatory Visit: Payer: Self-pay

## 2019-03-03 DIAGNOSIS — K625 Hemorrhage of anus and rectum: Secondary | ICD-10-CM

## 2019-03-06 DIAGNOSIS — M79604 Pain in right leg: Secondary | ICD-10-CM | POA: Diagnosis not present

## 2019-03-06 DIAGNOSIS — M25552 Pain in left hip: Secondary | ICD-10-CM | POA: Diagnosis not present

## 2019-03-06 DIAGNOSIS — M79605 Pain in left leg: Secondary | ICD-10-CM | POA: Diagnosis not present

## 2019-03-06 DIAGNOSIS — M25551 Pain in right hip: Secondary | ICD-10-CM | POA: Diagnosis not present

## 2019-03-08 DIAGNOSIS — M25551 Pain in right hip: Secondary | ICD-10-CM | POA: Diagnosis not present

## 2019-03-08 DIAGNOSIS — M79604 Pain in right leg: Secondary | ICD-10-CM | POA: Diagnosis not present

## 2019-03-08 DIAGNOSIS — M25552 Pain in left hip: Secondary | ICD-10-CM | POA: Diagnosis not present

## 2019-03-08 DIAGNOSIS — M79605 Pain in left leg: Secondary | ICD-10-CM | POA: Diagnosis not present

## 2019-03-13 DIAGNOSIS — M25552 Pain in left hip: Secondary | ICD-10-CM | POA: Diagnosis not present

## 2019-03-13 DIAGNOSIS — M79605 Pain in left leg: Secondary | ICD-10-CM | POA: Diagnosis not present

## 2019-03-13 DIAGNOSIS — M79604 Pain in right leg: Secondary | ICD-10-CM | POA: Diagnosis not present

## 2019-03-13 DIAGNOSIS — M25551 Pain in right hip: Secondary | ICD-10-CM | POA: Diagnosis not present

## 2019-03-15 DIAGNOSIS — M25552 Pain in left hip: Secondary | ICD-10-CM | POA: Diagnosis not present

## 2019-03-15 DIAGNOSIS — M79605 Pain in left leg: Secondary | ICD-10-CM | POA: Diagnosis not present

## 2019-03-15 DIAGNOSIS — M25551 Pain in right hip: Secondary | ICD-10-CM | POA: Diagnosis not present

## 2019-03-15 DIAGNOSIS — M79604 Pain in right leg: Secondary | ICD-10-CM | POA: Diagnosis not present

## 2019-03-20 DIAGNOSIS — M79605 Pain in left leg: Secondary | ICD-10-CM | POA: Diagnosis not present

## 2019-03-20 DIAGNOSIS — M25552 Pain in left hip: Secondary | ICD-10-CM | POA: Diagnosis not present

## 2019-03-20 DIAGNOSIS — M79604 Pain in right leg: Secondary | ICD-10-CM | POA: Diagnosis not present

## 2019-03-20 DIAGNOSIS — M25551 Pain in right hip: Secondary | ICD-10-CM | POA: Diagnosis not present

## 2019-03-22 DIAGNOSIS — M25552 Pain in left hip: Secondary | ICD-10-CM | POA: Diagnosis not present

## 2019-03-22 DIAGNOSIS — M79605 Pain in left leg: Secondary | ICD-10-CM | POA: Diagnosis not present

## 2019-03-22 DIAGNOSIS — M79604 Pain in right leg: Secondary | ICD-10-CM | POA: Diagnosis not present

## 2019-03-22 DIAGNOSIS — M25551 Pain in right hip: Secondary | ICD-10-CM | POA: Diagnosis not present

## 2019-03-27 DIAGNOSIS — M25552 Pain in left hip: Secondary | ICD-10-CM | POA: Diagnosis not present

## 2019-03-27 DIAGNOSIS — M79605 Pain in left leg: Secondary | ICD-10-CM | POA: Diagnosis not present

## 2019-03-27 DIAGNOSIS — M79604 Pain in right leg: Secondary | ICD-10-CM | POA: Diagnosis not present

## 2019-03-27 DIAGNOSIS — M25551 Pain in right hip: Secondary | ICD-10-CM | POA: Diagnosis not present

## 2019-03-29 DIAGNOSIS — M79604 Pain in right leg: Secondary | ICD-10-CM | POA: Diagnosis not present

## 2019-03-29 DIAGNOSIS — M25552 Pain in left hip: Secondary | ICD-10-CM | POA: Diagnosis not present

## 2019-03-29 DIAGNOSIS — M79605 Pain in left leg: Secondary | ICD-10-CM | POA: Diagnosis not present

## 2019-03-29 DIAGNOSIS — M25551 Pain in right hip: Secondary | ICD-10-CM | POA: Diagnosis not present

## 2019-04-03 DIAGNOSIS — M79605 Pain in left leg: Secondary | ICD-10-CM | POA: Diagnosis not present

## 2019-04-03 DIAGNOSIS — M79604 Pain in right leg: Secondary | ICD-10-CM | POA: Diagnosis not present

## 2019-04-03 DIAGNOSIS — M25551 Pain in right hip: Secondary | ICD-10-CM | POA: Diagnosis not present

## 2019-04-03 DIAGNOSIS — M25552 Pain in left hip: Secondary | ICD-10-CM | POA: Diagnosis not present

## 2019-04-05 DIAGNOSIS — M25551 Pain in right hip: Secondary | ICD-10-CM | POA: Diagnosis not present

## 2019-04-05 DIAGNOSIS — M79604 Pain in right leg: Secondary | ICD-10-CM | POA: Diagnosis not present

## 2019-04-05 DIAGNOSIS — M25552 Pain in left hip: Secondary | ICD-10-CM | POA: Diagnosis not present

## 2019-04-05 DIAGNOSIS — M79605 Pain in left leg: Secondary | ICD-10-CM | POA: Diagnosis not present

## 2019-04-10 DIAGNOSIS — M25552 Pain in left hip: Secondary | ICD-10-CM | POA: Diagnosis not present

## 2019-04-10 DIAGNOSIS — M79604 Pain in right leg: Secondary | ICD-10-CM | POA: Diagnosis not present

## 2019-04-10 DIAGNOSIS — M25551 Pain in right hip: Secondary | ICD-10-CM | POA: Diagnosis not present

## 2019-04-10 DIAGNOSIS — M79605 Pain in left leg: Secondary | ICD-10-CM | POA: Diagnosis not present

## 2019-04-12 DIAGNOSIS — M79604 Pain in right leg: Secondary | ICD-10-CM | POA: Diagnosis not present

## 2019-04-12 DIAGNOSIS — M25552 Pain in left hip: Secondary | ICD-10-CM | POA: Diagnosis not present

## 2019-04-12 DIAGNOSIS — M79605 Pain in left leg: Secondary | ICD-10-CM | POA: Diagnosis not present

## 2019-04-12 DIAGNOSIS — M25551 Pain in right hip: Secondary | ICD-10-CM | POA: Diagnosis not present

## 2019-04-17 DIAGNOSIS — M25552 Pain in left hip: Secondary | ICD-10-CM | POA: Diagnosis not present

## 2019-04-17 DIAGNOSIS — M79604 Pain in right leg: Secondary | ICD-10-CM | POA: Diagnosis not present

## 2019-04-17 DIAGNOSIS — M25551 Pain in right hip: Secondary | ICD-10-CM | POA: Diagnosis not present

## 2019-04-17 DIAGNOSIS — M79605 Pain in left leg: Secondary | ICD-10-CM | POA: Diagnosis not present

## 2019-04-19 DIAGNOSIS — M79604 Pain in right leg: Secondary | ICD-10-CM | POA: Diagnosis not present

## 2019-04-19 DIAGNOSIS — M79605 Pain in left leg: Secondary | ICD-10-CM | POA: Diagnosis not present

## 2019-04-19 DIAGNOSIS — M25552 Pain in left hip: Secondary | ICD-10-CM | POA: Diagnosis not present

## 2019-04-19 DIAGNOSIS — M25551 Pain in right hip: Secondary | ICD-10-CM | POA: Diagnosis not present

## 2019-04-24 DIAGNOSIS — M25552 Pain in left hip: Secondary | ICD-10-CM | POA: Diagnosis not present

## 2019-04-24 DIAGNOSIS — M25551 Pain in right hip: Secondary | ICD-10-CM | POA: Diagnosis not present

## 2019-04-24 DIAGNOSIS — M79604 Pain in right leg: Secondary | ICD-10-CM | POA: Diagnosis not present

## 2019-04-24 DIAGNOSIS — M79605 Pain in left leg: Secondary | ICD-10-CM | POA: Diagnosis not present

## 2019-04-26 DIAGNOSIS — K219 Gastro-esophageal reflux disease without esophagitis: Secondary | ICD-10-CM | POA: Diagnosis not present

## 2019-04-26 DIAGNOSIS — N400 Enlarged prostate without lower urinary tract symptoms: Secondary | ICD-10-CM | POA: Diagnosis not present

## 2019-04-26 DIAGNOSIS — D696 Thrombocytopenia, unspecified: Secondary | ICD-10-CM | POA: Diagnosis not present

## 2019-04-26 DIAGNOSIS — M436 Torticollis: Secondary | ICD-10-CM | POA: Diagnosis not present

## 2019-04-26 DIAGNOSIS — R972 Elevated prostate specific antigen [PSA]: Secondary | ICD-10-CM | POA: Diagnosis not present

## 2019-04-26 DIAGNOSIS — Z0000 Encounter for general adult medical examination without abnormal findings: Secondary | ICD-10-CM | POA: Diagnosis not present

## 2019-04-26 DIAGNOSIS — E785 Hyperlipidemia, unspecified: Secondary | ICD-10-CM | POA: Diagnosis not present

## 2019-04-26 DIAGNOSIS — J309 Allergic rhinitis, unspecified: Secondary | ICD-10-CM | POA: Diagnosis not present

## 2019-04-26 DIAGNOSIS — R7309 Other abnormal glucose: Secondary | ICD-10-CM | POA: Diagnosis not present

## 2019-04-27 ENCOUNTER — Telehealth: Payer: Self-pay | Admitting: Nurse Practitioner

## 2019-04-27 DIAGNOSIS — M79605 Pain in left leg: Secondary | ICD-10-CM | POA: Diagnosis not present

## 2019-04-27 DIAGNOSIS — M79604 Pain in right leg: Secondary | ICD-10-CM | POA: Diagnosis not present

## 2019-04-27 DIAGNOSIS — M25551 Pain in right hip: Secondary | ICD-10-CM | POA: Diagnosis not present

## 2019-04-27 DIAGNOSIS — M25552 Pain in left hip: Secondary | ICD-10-CM | POA: Diagnosis not present

## 2019-04-27 NOTE — Telephone Encounter (Signed)
Noted  

## 2019-04-27 NOTE — Telephone Encounter (Signed)
Please review patient information

## 2019-04-27 NOTE — Telephone Encounter (Signed)
Pt reported that he has been doing better on Miralax and has not experienced any rectal bleeding.  He also stated that he had labs done and that his levels are now normal.  FYI

## 2019-04-28 NOTE — Telephone Encounter (Signed)
Noted  

## 2019-05-01 NOTE — Telephone Encounter (Signed)
Labs received from [place] at [facility], CBCw/Diff 04/26/2019 waiting for [facility] review.

## 2019-05-01 NOTE — Telephone Encounter (Signed)
The patient stated he had his labs drawn last week at [facility] by Dr Orland Mustard. He will have them fax the results to [place]. He stated that his platelet count has gone up to 155.

## 2019-05-01 NOTE — Telephone Encounter (Signed)
-----   Message from Mohammed Kindle, RN sent at 03/03/2019  9:14 AM EST ----- Regarding: repeat lab work Olivia Mackie,  Please call or send a letter to this patient to remind him of the repeat lab work [place] requested from him.  The lab work is due now.  The order has already been placed in Epic; Thanks, Bre

## 2019-05-02 DIAGNOSIS — M79605 Pain in left leg: Secondary | ICD-10-CM | POA: Diagnosis not present

## 2019-05-02 DIAGNOSIS — M25552 Pain in left hip: Secondary | ICD-10-CM | POA: Diagnosis not present

## 2019-05-02 DIAGNOSIS — M25551 Pain in right hip: Secondary | ICD-10-CM | POA: Diagnosis not present

## 2019-05-02 DIAGNOSIS — M79604 Pain in right leg: Secondary | ICD-10-CM | POA: Diagnosis not present

## 2019-05-03 DIAGNOSIS — H35372 Puckering of macula, left eye: Secondary | ICD-10-CM | POA: Diagnosis not present

## 2019-05-03 DIAGNOSIS — H04123 Dry eye syndrome of bilateral lacrimal glands: Secondary | ICD-10-CM | POA: Diagnosis not present

## 2019-05-03 DIAGNOSIS — H43812 Vitreous degeneration, left eye: Secondary | ICD-10-CM | POA: Diagnosis not present

## 2019-05-03 DIAGNOSIS — H26492 Other secondary cataract, left eye: Secondary | ICD-10-CM | POA: Diagnosis not present

## 2019-05-04 DIAGNOSIS — M79604 Pain in right leg: Secondary | ICD-10-CM | POA: Diagnosis not present

## 2019-05-04 DIAGNOSIS — M25552 Pain in left hip: Secondary | ICD-10-CM | POA: Diagnosis not present

## 2019-05-04 DIAGNOSIS — M25551 Pain in right hip: Secondary | ICD-10-CM | POA: Diagnosis not present

## 2019-05-04 DIAGNOSIS — M79605 Pain in left leg: Secondary | ICD-10-CM | POA: Diagnosis not present

## 2019-05-08 DIAGNOSIS — M25551 Pain in right hip: Secondary | ICD-10-CM | POA: Diagnosis not present

## 2019-05-08 DIAGNOSIS — M25552 Pain in left hip: Secondary | ICD-10-CM | POA: Diagnosis not present

## 2019-05-08 DIAGNOSIS — M79605 Pain in left leg: Secondary | ICD-10-CM | POA: Diagnosis not present

## 2019-05-08 DIAGNOSIS — M79604 Pain in right leg: Secondary | ICD-10-CM | POA: Diagnosis not present

## 2019-05-15 DIAGNOSIS — R972 Elevated prostate specific antigen [PSA]: Secondary | ICD-10-CM | POA: Diagnosis not present

## 2019-05-15 DIAGNOSIS — N529 Male erectile dysfunction, unspecified: Secondary | ICD-10-CM | POA: Diagnosis not present

## 2019-05-15 DIAGNOSIS — N138 Other obstructive and reflux uropathy: Secondary | ICD-10-CM | POA: Diagnosis not present

## 2019-05-15 DIAGNOSIS — N401 Enlarged prostate with lower urinary tract symptoms: Secondary | ICD-10-CM | POA: Diagnosis not present

## 2019-05-22 DIAGNOSIS — M79605 Pain in left leg: Secondary | ICD-10-CM | POA: Diagnosis not present

## 2019-05-22 DIAGNOSIS — M25552 Pain in left hip: Secondary | ICD-10-CM | POA: Diagnosis not present

## 2019-05-22 DIAGNOSIS — M79604 Pain in right leg: Secondary | ICD-10-CM | POA: Diagnosis not present

## 2019-05-22 DIAGNOSIS — M25551 Pain in right hip: Secondary | ICD-10-CM | POA: Diagnosis not present

## 2019-06-01 DIAGNOSIS — Z79899 Other long term (current) drug therapy: Secondary | ICD-10-CM | POA: Diagnosis not present

## 2019-06-01 DIAGNOSIS — G243 Spasmodic torticollis: Secondary | ICD-10-CM | POA: Diagnosis not present

## 2019-06-23 DIAGNOSIS — G243 Spasmodic torticollis: Secondary | ICD-10-CM | POA: Diagnosis not present

## 2019-07-17 ENCOUNTER — Telehealth: Payer: Self-pay | Admitting: Orthopaedic Surgery

## 2019-07-17 NOTE — Telephone Encounter (Signed)
Patient called advised he no longer need the Rx (Tizanidine and Clonazepam)  The number to contact patient is [phone]

## 2019-07-17 NOTE — Telephone Encounter (Signed)
FYI States no longer needs [place] to fill this for him anymore, his new neurologist now fills this for him. It sends automatically at times for "automatic refill" with the pharmacy And he says thank you

## 2019-08-16 DIAGNOSIS — R972 Elevated prostate specific antigen [PSA]: Secondary | ICD-10-CM | POA: Diagnosis not present

## 2019-08-25 ENCOUNTER — Other Ambulatory Visit: Payer: Self-pay | Admitting: Orthopaedic Surgery

## 2019-08-25 NOTE — Telephone Encounter (Signed)
Can you advise or hold for Dr. Ninfa Linden?

## 2019-08-29 ENCOUNTER — Ambulatory Visit: Payer: Medicare HMO | Admitting: Orthopaedic Surgery

## 2019-09-13 ENCOUNTER — Ambulatory Visit: Payer: Medicare HMO | Admitting: Orthopaedic Surgery

## 2019-09-13 ENCOUNTER — Encounter: Payer: Self-pay | Admitting: Orthopaedic Surgery

## 2019-09-13 DIAGNOSIS — M1811 Unilateral primary osteoarthritis of first carpometacarpal joint, right hand: Secondary | ICD-10-CM

## 2019-09-13 DIAGNOSIS — M65321 Trigger finger, right index finger: Secondary | ICD-10-CM

## 2019-09-13 DIAGNOSIS — M7702 Medial epicondylitis, left elbow: Secondary | ICD-10-CM

## 2019-09-13 MED ORDER — METHYLPREDNISOLONE ACETATE 40 MG/ML IJ SUSP
20.0000 mg | INTRAMUSCULAR | Status: AC | PRN
  Administered 2019-09-13: 20 mg

## 2019-09-13 MED ORDER — LIDOCAINE HCL 1 % IJ SOLN
0.5000 mL | INTRAMUSCULAR | Status: AC | PRN
  Administered 2019-09-13: .5 mL

## 2019-09-13 MED ORDER — METHYLPREDNISOLONE ACETATE 40 MG/ML IJ SUSP
40.0000 mg | INTRAMUSCULAR | Status: AC | PRN
  Administered 2019-09-13: 40 mg

## 2019-09-13 NOTE — Progress Notes (Signed)
Office Visit Note   Patient: Juan Kennedy           Date of Birth: [birth year]           MRN: [id number] Visit Date: 09/13/2019              Requested by: London Pepper, MD [address],  [place] [address] PCP: London Pepper, MD   Assessment & Plan: Visit Diagnoses:  1. Trigger finger, right index finger   2. Arthritis of carpometacarpal (CMC) joint of right thumb   3. Medial epicondylitis, left elbow     Plan: I was able to provide injections in both the right hand basilar thumb joint and index finger A1 pulley as well as the left elbow medial epicondyl.  He tolerated all these well.  All questions and concerns were answered and addressed.  Follow-up will be as needed.  Follow-Up Instructions: Return if symptoms worsen or fail to improve.   Orders:  Orders Placed This Encounter  Procedures  . Hand/UE Inj  . Hand/UE Inj  . Hand/UE Inj   No orders of the defined types were placed in this encounter.     Procedures: Hand/UE Inj: L elbow for medial epicondylitis on 09/13/2019 1:18 PM Medications: 0.5 mL lidocaine 1 %; 40 mg methylPREDNISolone acetate 40 MG/ML; 20 mg methylPREDNISolone acetate 40 MG/ML  Hand/UE Inj: R index A1 for trigger finger on 09/13/2019 1:18 PM Medications: 0.5 mL lidocaine 1 %; 20 mg methylPREDNISolone acetate 40 MG/ML  Hand/UE Inj: R thumb CMC for osteoarthritis on 09/13/2019 1:19 PM Medications: 0.5 mL lidocaine 1 %; 20 mg methylPREDNISolone acetate 40 MG/ML      Clinical Data: No additional findings.   Subjective: Chief Complaint  Patient presents with  . Right Hand - Pain  The patient is well-known to me.  He comes in requesting steroid injections in his left elbow for medial epicondylitis as well as his right thumb for [facility] joint arthritis and the right index finger for triggering.  He has had injections now 3 years before but has been 11 months.  He has had no other acute change in medical status.  He is  dealing with cervical dystonia and is seeing a specialist at [facility] for that.  HPI  Review of Systems He currently denies any headache, chest pain, shortness of breath, fever, chills, nausea, vomiting  Objective: Vital Signs: There were no vitals taken for this visit.  Physical Exam Is alert and orient x3 and in no acute distress Ortho Exam Examination of his left elbow shows pain over the medial epicondyle consistent with medial epicondylitis and he has had this before.  His range of motion is full and elbow is ligamentously stable.  Examination of his right hand shows grinding at the basilar thumb joint with pain as well as active triggering and pain over the A1 pulley of the index finger. Specialty Comments:  No specialty comments available.  Imaging: No results found.   PMFS History: Patient Active Problem List   Diagnosis Date Noted  . Rectal bleeding 03/02/2019  . Anal fissure 03/02/2019  . Cervicalgia 09/07/2016  . Cervical spondylosis without myelopathy 09/07/2016  . Myofascitis 09/07/2016  . Torticollis 09/07/2016   Past Medical History:  Diagnosis Date  . Allergy   . Anemia    in past  . Arthritis    OA  . BPH (benign prostatic hyperplasia)   . GERD (gastroesophageal reflux disease)   . High cholesterol   .  Reflux     Family History  Problem Relation Age of Onset  . Lung cancer Father   . Other Mother   . Colon cancer Neg Hx   . Esophageal cancer Neg Hx   . Stomach cancer Neg Hx   . Pancreatic cancer Neg Hx   . Liver disease Neg Hx     Past Surgical History:  Procedure Laterality Date  . COLONOSCOPY    . HEMORRHOID SURGERY    . ROTATOR CUFF REPAIR Right   . SHOULDER SURGERY Left    Social History   Occupational History  . Occupation: retired  Tobacco Use  . Smoking status: Former Smoker    Quit date: 1990    Years since quitting: 31.6  . Smokeless tobacco: Never Used  . Tobacco comment: light smoker until early 90s  Vaping Use    . Vaping Use: Never used  Substance and Sexual Activity  . Alcohol use: No  . Drug use: No  . Sexual activity: Not on file

## 2019-09-25 DIAGNOSIS — G243 Spasmodic torticollis: Secondary | ICD-10-CM | POA: Diagnosis not present

## 2019-11-01 DIAGNOSIS — N138 Other obstructive and reflux uropathy: Secondary | ICD-10-CM | POA: Diagnosis not present

## 2019-11-01 DIAGNOSIS — M549 Dorsalgia, unspecified: Secondary | ICD-10-CM | POA: Diagnosis not present

## 2019-11-01 DIAGNOSIS — K219 Gastro-esophageal reflux disease without esophagitis: Secondary | ICD-10-CM | POA: Diagnosis not present

## 2019-11-01 DIAGNOSIS — M436 Torticollis: Secondary | ICD-10-CM | POA: Diagnosis not present

## 2019-11-01 DIAGNOSIS — Z23 Encounter for immunization: Secondary | ICD-10-CM | POA: Diagnosis not present

## 2019-11-01 DIAGNOSIS — N401 Enlarged prostate with lower urinary tract symptoms: Secondary | ICD-10-CM | POA: Diagnosis not present

## 2019-11-01 DIAGNOSIS — R972 Elevated prostate specific antigen [PSA]: Secondary | ICD-10-CM | POA: Diagnosis not present

## 2019-11-01 DIAGNOSIS — E785 Hyperlipidemia, unspecified: Secondary | ICD-10-CM | POA: Diagnosis not present

## 2019-11-13 DIAGNOSIS — N138 Other obstructive and reflux uropathy: Secondary | ICD-10-CM | POA: Diagnosis not present

## 2019-11-13 DIAGNOSIS — R972 Elevated prostate specific antigen [PSA]: Secondary | ICD-10-CM | POA: Diagnosis not present

## 2019-11-13 DIAGNOSIS — N529 Male erectile dysfunction, unspecified: Secondary | ICD-10-CM | POA: Diagnosis not present

## 2019-11-13 DIAGNOSIS — N401 Enlarged prostate with lower urinary tract symptoms: Secondary | ICD-10-CM | POA: Diagnosis not present

## 2020-02-28 DIAGNOSIS — G243 Spasmodic torticollis: Secondary | ICD-10-CM | POA: Diagnosis not present

## 2020-04-01 DIAGNOSIS — G243 Spasmodic torticollis: Secondary | ICD-10-CM | POA: Diagnosis not present

## 2020-04-16 DIAGNOSIS — K4090 Unilateral inguinal hernia, without obstruction or gangrene, not specified as recurrent: Secondary | ICD-10-CM | POA: Diagnosis not present

## 2020-04-16 DIAGNOSIS — N138 Other obstructive and reflux uropathy: Secondary | ICD-10-CM | POA: Diagnosis not present

## 2020-04-16 DIAGNOSIS — K429 Umbilical hernia without obstruction or gangrene: Secondary | ICD-10-CM | POA: Diagnosis not present

## 2020-04-16 DIAGNOSIS — N401 Enlarged prostate with lower urinary tract symptoms: Secondary | ICD-10-CM | POA: Diagnosis not present

## 2020-04-16 DIAGNOSIS — R972 Elevated prostate specific antigen [PSA]: Secondary | ICD-10-CM | POA: Diagnosis not present

## 2020-04-22 DIAGNOSIS — R1903 Right lower quadrant abdominal swelling, mass and lump: Secondary | ICD-10-CM | POA: Diagnosis not present

## 2020-04-22 DIAGNOSIS — K429 Umbilical hernia without obstruction or gangrene: Secondary | ICD-10-CM | POA: Diagnosis not present

## 2020-04-26 ENCOUNTER — Encounter: Payer: Self-pay | Admitting: Orthopaedic Surgery

## 2020-04-30 ENCOUNTER — Encounter: Payer: Self-pay | Admitting: Orthopaedic Surgery

## 2020-05-06 DIAGNOSIS — H35372 Puckering of macula, left eye: Secondary | ICD-10-CM | POA: Diagnosis not present

## 2020-05-06 DIAGNOSIS — H43812 Vitreous degeneration, left eye: Secondary | ICD-10-CM | POA: Diagnosis not present

## 2020-05-06 DIAGNOSIS — H26492 Other secondary cataract, left eye: Secondary | ICD-10-CM | POA: Diagnosis not present

## 2020-05-06 DIAGNOSIS — H04123 Dry eye syndrome of bilateral lacrimal glands: Secondary | ICD-10-CM | POA: Diagnosis not present

## 2020-05-22 DIAGNOSIS — N400 Enlarged prostate without lower urinary tract symptoms: Secondary | ICD-10-CM | POA: Diagnosis not present

## 2020-05-22 DIAGNOSIS — R972 Elevated prostate specific antigen [PSA]: Secondary | ICD-10-CM | POA: Diagnosis not present

## 2020-05-22 DIAGNOSIS — M436 Torticollis: Secondary | ICD-10-CM | POA: Diagnosis not present

## 2020-05-22 DIAGNOSIS — J309 Allergic rhinitis, unspecified: Secondary | ICD-10-CM | POA: Diagnosis not present

## 2020-05-22 DIAGNOSIS — R7309 Other abnormal glucose: Secondary | ICD-10-CM | POA: Diagnosis not present

## 2020-05-22 DIAGNOSIS — Z0000 Encounter for general adult medical examination without abnormal findings: Secondary | ICD-10-CM | POA: Diagnosis not present

## 2020-05-22 DIAGNOSIS — D696 Thrombocytopenia, unspecified: Secondary | ICD-10-CM | POA: Diagnosis not present

## 2020-05-22 DIAGNOSIS — E785 Hyperlipidemia, unspecified: Secondary | ICD-10-CM | POA: Diagnosis not present

## 2020-05-22 DIAGNOSIS — K219 Gastro-esophageal reflux disease without esophagitis: Secondary | ICD-10-CM | POA: Diagnosis not present

## 2020-05-29 DIAGNOSIS — R972 Elevated prostate specific antigen [PSA]: Secondary | ICD-10-CM | POA: Diagnosis not present

## 2020-06-06 DIAGNOSIS — N401 Enlarged prostate with lower urinary tract symptoms: Secondary | ICD-10-CM | POA: Diagnosis not present

## 2020-06-06 DIAGNOSIS — N529 Male erectile dysfunction, unspecified: Secondary | ICD-10-CM | POA: Diagnosis not present

## 2020-06-06 DIAGNOSIS — R972 Elevated prostate specific antigen [PSA]: Secondary | ICD-10-CM | POA: Diagnosis not present

## 2020-06-06 DIAGNOSIS — N138 Other obstructive and reflux uropathy: Secondary | ICD-10-CM | POA: Diagnosis not present

## 2020-06-10 ENCOUNTER — Other Ambulatory Visit: Payer: Self-pay | Admitting: Surgery

## 2020-06-10 DIAGNOSIS — K4090 Unilateral inguinal hernia, without obstruction or gangrene, not specified as recurrent: Secondary | ICD-10-CM | POA: Diagnosis not present

## 2020-06-10 DIAGNOSIS — K429 Umbilical hernia without obstruction or gangrene: Secondary | ICD-10-CM | POA: Diagnosis not present

## 2020-07-11 ENCOUNTER — Telehealth: Payer: Self-pay | Admitting: Physical Medicine and Rehabilitation

## 2020-07-11 NOTE — Telephone Encounter (Signed)
Scheduled for 6/22 at 1300 with Dr. Ernestina Patches and 1345 with Dr. Ninfa Linden.

## 2020-07-11 NOTE — Telephone Encounter (Signed)
Pt wanting to make an appt for hip and lower back pain. Pt states its the same as previous that he saw Dr. Junius Roads for and also asked if he can get his appt with Dr. Junius Roads and Dr. Ninfa Linden for the same day (Pt understands we might have to change his appt with Dr. Ninfa Linden to match with a day Dr. Junius Roads has open) The best call back number is [phone].

## 2020-07-24 ENCOUNTER — Ambulatory Visit: Payer: Medicare HMO | Admitting: Orthopaedic Surgery

## 2020-07-24 ENCOUNTER — Ambulatory Visit (INDEPENDENT_AMBULATORY_CARE_PROVIDER_SITE_OTHER): Payer: Medicare HMO | Admitting: Physical Medicine and Rehabilitation

## 2020-07-24 ENCOUNTER — Encounter: Payer: Self-pay | Admitting: Orthopaedic Surgery

## 2020-07-24 ENCOUNTER — Encounter: Payer: Self-pay | Admitting: Physical Medicine and Rehabilitation

## 2020-07-24 ENCOUNTER — Ambulatory Visit (INDEPENDENT_AMBULATORY_CARE_PROVIDER_SITE_OTHER): Payer: Medicare HMO

## 2020-07-24 ENCOUNTER — Other Ambulatory Visit: Payer: Self-pay

## 2020-07-24 VITALS — BP 116/75 | HR 79

## 2020-07-24 DIAGNOSIS — S39012A Strain of muscle, fascia and tendon of lower back, initial encounter: Secondary | ICD-10-CM

## 2020-07-24 DIAGNOSIS — M48062 Spinal stenosis, lumbar region with neurogenic claudication: Secondary | ICD-10-CM

## 2020-07-24 DIAGNOSIS — M65321 Trigger finger, right index finger: Secondary | ICD-10-CM | POA: Diagnosis not present

## 2020-07-24 DIAGNOSIS — M1811 Unilateral primary osteoarthritis of first carpometacarpal joint, right hand: Secondary | ICD-10-CM

## 2020-07-24 DIAGNOSIS — M1812 Unilateral primary osteoarthritis of first carpometacarpal joint, left hand: Secondary | ICD-10-CM

## 2020-07-24 DIAGNOSIS — G243 Spasmodic torticollis: Secondary | ICD-10-CM

## 2020-07-24 MED ORDER — METHYLPREDNISOLONE ACETATE 40 MG/ML IJ SUSP
1.0000 mg | INTRAMUSCULAR | Status: AC | PRN
Start: 2020-07-24 — End: 2020-07-24
  Administered 2020-07-24: 1 mg

## 2020-07-24 MED ORDER — LIDOCAINE HCL 1 % IJ SOLN
1.0000 mL | INTRAMUSCULAR | Status: AC | PRN
  Administered 2020-07-24: 1 mL

## 2020-07-24 MED ORDER — LIDOCAINE HCL 1 % IJ SOLN
1.0000 mL | INTRAMUSCULAR | Status: AC | PRN
Start: 2020-07-24 — End: 2020-07-24
  Administered 2020-07-24: 1 mL

## 2020-07-24 MED ORDER — METHYLPREDNISOLONE ACETATE 40 MG/ML IJ SUSP
1.0000 mg | INTRAMUSCULAR | Status: AC | PRN
  Administered 2020-07-24: 1 mg

## 2020-07-24 MED ORDER — PREDNISONE 50 MG PO TABS: ORAL_TABLET | ORAL | 0 refills | Status: DC

## 2020-07-24 NOTE — Progress Notes (Signed)
Low back and bilateral posterior hip pain. Pain sometimes radiates down posterior legs. Sometimes has cramping pain in legs and hips. Pain started about 3 weeks ago. Numeric Pain Rating Scale and Functional Assessment Average Pain 5 Pain Right Now 5 My pain is constant, dull, and aching Pain is worse with: walking and some activites Pain improves with: rest   In the last MONTH (on 0-10 scale) has pain interfered with the following?  1. General activity like being  able to carry out your everyday physical activities such as walking, climbing stairs, carrying groceries, or moving a chair?  Rating(7)  2. Relation with others like being able to carry out your usual social activities and roles such as  activities at home, at work and in your community. Rating(7)  3. Enjoyment of life such that you have  been bothered by emotional problems such as feeling anxious, depressed or irritable?  Rating(7)

## 2020-07-24 NOTE — Progress Notes (Signed)
Juan Kennedy - 80 y.o. male MRN [id number]  Date of birth: May 14, 1940  Office Visit Note: Visit Date: 07/24/2020 PCP: London Pepper, MD Referred by: London Pepper, MD  Subjective: Chief Complaint  Patient presents with   Lower Back - Pain   HPI: Juan Kennedy is a 80 y.o. male who comes in today as a self-referral for evaluation of chronic worsening and severe lower back pain radiating to bilateral posterior hips and legs. Pt is being seen today for an acute exacerbation of his chronic pain. Pt reports pain started approximately 3 weeks ago after carrying bales of pine straw to back yard. Pt describes pain as a cramping and burning sensation. Pt reports pain increases with walking, prolonged standing and bending backwards. Pt states pain is relieved by rest, sitting, and medications such as Tizanidine,Tylenol and Naproxen. Pt's lumbar MRI from 2014 exhibits moderate multi-level stenosis. Pt reports history of numerous epidural steroid injections, which he reports did help significantly at the time. Pt also reports he continues to have issues with cervical dystonia for which he is currently being managed by Dr. Audrea Muscat Thenganatt at [facility]. Pt denies focal weakness, numbness and tingling. Pt denies recent trauma or fall.   Review of Systems  Musculoskeletal:  Positive for back pain.  Neurological:  Negative for tingling, sensory change and focal weakness.  All other systems reviewed and are negative. Otherwise per HPI.  Assessment & Plan: Visit Diagnoses:    ICD-10-CM   1. Lumbar spine strain, initial encounter  S39.012A XR Lumbar Spine 2-3 Views    predniSONE (DELTASONE) 50 MG tablet    2. Spinal stenosis of lumbar region with neurogenic claudication  M48.062 XR Lumbar Spine 2-3 Views    predniSONE (DELTASONE) 50 MG tablet    3. Cervical dystonia  G24.3        Plan: Findings:  Chronic, worsening, and severe lower back pain with neurogenic claudication.  Patient reports worsening of symptoms 3 weeks ago after lifting bales of pine needles. Pt's clinical presentation today is consistent with lumbar strain. Pt's previous MRI exhibits multi-level stenosis, which would need to be repeated if his symptoms persist due to concern of worsening stenosis. Today, we are prescribing a short course of oral steroids and instructing patient to use Tylenol 3 times a day as well as Tizanidine as needed. Pt instructed to let us know if pain improves or worsens so that we can move forward with possible repeat MRI and also explore potential epidural steroid injection depending on symptoms and MRI. As mentioned previously, pt continues to have issues with cervical dystonia and reports he has stopped Botox treatments due to increased pain. Pt encouraged to follow-up with his neurologist to explore further treatment options.   He continues to see Dr. Jean Rosenthal from an orthopedic standpoint.  He actually has appointment with him right after our appointment today.   Meds & Orders:  Meds ordered this encounter  Medications   predniSONE (DELTASONE) 50 MG tablet    Sig: Take 1 tablet daily with food for 5 days until finished    Dispense:  5 tablet    Refill:  0    Orders Placed This Encounter  Procedures   XR Lumbar Spine 2-3 Views    Follow-up: Return if symptoms worsen or fail to improve.   Procedures: No procedures performed      Clinical History: Lumbar Spine MRI 11/05/2012   IMPRESSION: The dominant right-sided abnormality is at L4-5 where  a 6 x 8 mm synovial cyst projects into the lateral recess and foramen. Bilateral facet arthropathy is present with facet joint effusions. Right L5 and possibly right L4 nerve root impingement are noted.   Moderate central canal stenosis at L3-4 is multifactorial, secondary to central disc protrusion and posterior element hypertrophy. Bilateral L3 and L4 nerve root impingement are possible.   MRI CERVICAL  SPINE WITHOUT CONTRAST 08/29/2016    IMPRESSION: 1. Multilevel right-greater-than-left facet arthrosis, which may be a source of local neck pain. This is greatest at right C2-C3 where there is moderate to severe edema. 2. 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 3. Moderate left neural foraminal stenosis at C3-C4 due to combination of uncovertebral and facet hypertrophy. 4. Mild spinal canal stenosis at C6-C7 with moderate bilateral neural foraminal stenosis.   He reports that he quit smoking about 32 years ago. He has never used smokeless tobacco. No results for input(s): HGBA1C, LABURIC in the last 8760 hours.  Objective:  VS:  HT:    WT:   BMI:     BP:116/75  HR:79bpm  TEMP: ( )  RESP:  Physical Exam HENT:     Head: Normocephalic and atraumatic.     Right Ear: Tympanic membrane normal.     Left Ear: Tympanic membrane normal.     Nose: Nose normal.     Mouth/Throat:     Mouth: Mucous membranes are moist.  Eyes:     Pupils: Pupils are equal, round, and reactive to light.  Neck:     Comments: Discomfort noted with flexion, extension and side-to-side rotation. Right sided rotational torticollis upon exam.  Cardiovascular:     Rate and Rhythm: Normal rate and regular rhythm.     Pulses: Normal pulses.  Pulmonary:     Effort: Pulmonary effort is normal.  Abdominal:     General: There is no distension.  Musculoskeletal:     Right lower leg: No edema.     Left lower leg: No edema.     Comments: Pt is slow to rise from seated position to standing. Full range of motion. Strong distal strength without clonus, no pain upon palpation of greater trochanters. Sensation intact bilaterally. Walks independently, gait steady.    Skin:    General: Skin is warm and dry.  Neurological:     General: No focal deficit present.     Mental Status: He is alert.     Sensory: No sensory deficit.     Motor: No  weakness.     Gait: Gait normal.    Ortho Exam  Imaging: XR Lumbar Spine 2-3 Views  Result Date: 07/25/2020 AP and lateral lumbar spine shows mild degenerative joint disease of the hips left maybe more than right but very mild.  Pelvic heights are somewhat unequal with the right being slightly higher than the left.  There is degenerative scoliosis mild centered at about L2-3 with rightward curvature.  Disc heights are well-maintained there is facet arthropathy at L4-5 and L5-S1 with by foraminal narrowing.  No fractures or dislocations or listhesis.   Past Medical/Family/Surgical/Social History: Medications & Allergies reviewed per EMR, new medications updated. Patient Active Problem List   Diagnosis Date Noted   Rectal bleeding 03/02/2019   Anal fissure 03/02/2019   Cervicalgia 09/07/2016   Cervical spondylosis without myelopathy 09/07/2016   Myofascitis 09/07/2016   Torticollis 09/07/2016   Past Medical History:  Diagnosis Date   Allergy  Anemia    in past   Arthritis    OA   BPH (benign prostatic hyperplasia)    GERD (gastroesophageal reflux disease)    High cholesterol    Reflux    Family History  Problem Relation Age of Onset   Lung cancer Father    Other Mother    Colon cancer Neg Hx    Esophageal cancer Neg Hx    Stomach cancer Neg Hx    Pancreatic cancer Neg Hx    Liver disease Neg Hx    Past Surgical History:  Procedure Laterality Date   COLONOSCOPY     HEMORRHOID SURGERY     ROTATOR CUFF REPAIR Right    SHOULDER SURGERY Left    Social History   Occupational History   Occupation: retired  Tobacco Use   Smoking status: Former    Pack years: 0.00    Types: Cigarettes    Quit date: 1990    Years since quitting: 32.4   Smokeless tobacco: Never   Tobacco comments:    light smoker until early 90s  Vaping Use   Vaping Use: Never used  Substance and Sexual Activity   Alcohol use: No   Drug use: No   Sexual activity: Not on file

## 2020-07-24 NOTE — Progress Notes (Signed)
Office Visit Note   Patient: Juan Kennedy           Date of Birth: 04-06-1940           MRN: [id number] Visit Date: 07/24/2020              Requested by: London Pepper, MD [place] [address],  [place] [address] PCP: London Pepper, MD   Assessment & Plan: Visit Diagnoses:  1. Trigger finger, right index finger   2. Arthritis of carpometacarpal (CMC) joint of right thumb   3. Arthritis of carpometacarpal (CMC) joint of left thumb     Plan: Per his request I did provide steroid injections around the basilar thumb joint on both sides as well as the right index finger A1 pulley.  He tolerated these well.  All questions and concerns were answered and addressed.  Follow-up is as needed.  Follow-Up Instructions: Return if symptoms worsen or fail to improve.   Orders:  Orders Placed This Encounter  Procedures   Hand/UE Inj: R index A1   Hand/UE Inj: R thumb CMC   Hand/UE Inj: L thumb CMC   No orders of the defined types were placed in this encounter.     Procedures: Hand/UE Inj: R index A1 for trigger finger on 07/24/2020 2:44 PM Medications: 1 mL lidocaine 1 %; 1 mg methylPREDNISolone acetate 40 MG/ML   Hand/UE Inj: R thumb CMC for osteoarthritis on 07/24/2020 2:45 PM Medications: 1 mL lidocaine 1 %; 1 mg methylPREDNISolone acetate 40 MG/ML   Hand/UE Inj: L thumb CMC for osteoarthritis on 07/24/2020 2:45 PM Medications: 1 mL lidocaine 1 %; 1 mg methylPREDNISolone acetate 40 MG/ML     Clinical Data: No additional findings.   Subjective: Chief Complaint  Patient presents with   Right Hand - Pain   Left Hand - Pain  Juan Kennedy is well-known to me.  He comes in today requesting injections in his bilateral basilar thumb joints and his right index finger due to triggering.  We have injected these areas before but it has been a while.  He has been having pain in the base of both his thumbs and active triggering of his right index finger.  HPI  Review  of Systems He currently denies any headache, chest pain, shortness of breath, fever, chills, nausea, vomiting  Objective: Vital Signs: There were no vitals taken for this visit.  Physical Exam He is alert and orient x3 and in no acute distress Ortho Exam Examination of his thumbs on both sides shows a positive grind test at the basilar thumb joint and pain at the basilar thumb joint.  There is no triggering of his thumb on either side.  His right index finger has triggering with pain also at the A1 pulley. Specialty Comments:  No specialty comments available.  Imaging: No results found.   PMFS History: Patient Active Problem List   Diagnosis Date Noted   Rectal bleeding 03/02/2019   Anal fissure 03/02/2019   Cervicalgia 09/07/2016   Cervical spondylosis without myelopathy 09/07/2016   Myofascitis 09/07/2016   Torticollis 09/07/2016   Past Medical History:  Diagnosis Date   Allergy    Anemia    in past   Arthritis    OA   BPH (benign prostatic hyperplasia)    GERD (gastroesophageal reflux disease)    High cholesterol    Reflux     Family History  Problem Relation Age of Onset   Lung cancer Father  Other Mother    Colon cancer Neg Hx    Esophageal cancer Neg Hx    Stomach cancer Neg Hx    Pancreatic cancer Neg Hx    Liver disease Neg Hx     Past Surgical History:  Procedure Laterality Date   COLONOSCOPY     HEMORRHOID SURGERY     ROTATOR CUFF REPAIR Right    SHOULDER SURGERY Left    Social History   Occupational History   Occupation: retired  Tobacco Use   Smoking status: Former    Pack years: 0.00    Types: Cigarettes    Quit date: 1990    Years since quitting: 32.4   Smokeless tobacco: Never   Tobacco comments:    light smoker until early 90s  Vaping Use   Vaping Use: Never used  Substance and Sexual Activity   Alcohol use: No   Drug use: No   Sexual activity: Not on file

## 2020-08-07 ENCOUNTER — Telehealth: Payer: Self-pay | Admitting: Physical Medicine and Rehabilitation

## 2020-08-07 DIAGNOSIS — M48061 Spinal stenosis, lumbar region without neurogenic claudication: Secondary | ICD-10-CM

## 2020-08-07 NOTE — Telephone Encounter (Signed)
Pt called stating he discussed possibly doing an MRI with Dr. Ernestina Patches but then he never heard anything back. Pt would like a CB to discuss what the plan treatment plan is.   [phone]

## 2020-08-12 NOTE — Telephone Encounter (Signed)
MRI order placed; patient notified.

## 2020-08-18 ENCOUNTER — Ambulatory Visit
Admission: RE | Admit: 2020-08-18 | Discharge: 2020-08-18 | Disposition: A | Discharge status: Home / Self Care | Payer: Medicare HMO | Source: Ambulatory Visit | Attending: Physical Medicine and Rehabilitation | Admitting: Physical Medicine and Rehabilitation

## 2020-08-18 DIAGNOSIS — M48061 Spinal stenosis, lumbar region without neurogenic claudication: Secondary | ICD-10-CM | POA: Diagnosis not present

## 2020-08-18 DIAGNOSIS — M545 Low back pain, unspecified: Secondary | ICD-10-CM | POA: Diagnosis not present

## 2020-08-21 ENCOUNTER — Telehealth: Payer: Self-pay | Admitting: Physical Medicine and Rehabilitation

## 2020-08-21 NOTE — Telephone Encounter (Signed)
-----   Message from Magnus Sinning, MD sent at 08/19/2020 12:01 PM EDT ----- Regarding: MRI review Has severe stenosis at L3-4 and we can try shot there but may need to consult neurosurgeon for decompression. Can review and inject or just review whichever he thinks.

## 2020-08-22 ENCOUNTER — Telehealth: Payer: Self-pay | Admitting: Physical Medicine and Rehabilitation

## 2020-08-22 NOTE — Telephone Encounter (Signed)
Needs auth and scheduling for bilateral L3 TF.

## 2020-08-22 NOTE — Telephone Encounter (Signed)
-----   Message from Magnus Sinning, MD sent at 08/19/2020 12:01 PM EDT ----- Regarding: MRI review Has severe stenosis at L3-4 and we can try shot there but may need to consult neurosurgeon for decompression. Can review and inject or just review whichever he thinks.

## 2020-08-27 NOTE — Telephone Encounter (Signed)
Scheduled for 8/8 at 1100 with driver.

## 2020-09-09 ENCOUNTER — Ambulatory Visit: Payer: Medicare HMO | Admitting: Physical Medicine and Rehabilitation

## 2020-09-09 ENCOUNTER — Other Ambulatory Visit: Payer: Self-pay

## 2020-09-09 ENCOUNTER — Ambulatory Visit: Payer: Self-pay

## 2020-09-09 ENCOUNTER — Encounter: Payer: Self-pay | Admitting: Physical Medicine and Rehabilitation

## 2020-09-09 VITALS — BP 124/78 | HR 82

## 2020-09-09 DIAGNOSIS — M48062 Spinal stenosis, lumbar region with neurogenic claudication: Secondary | ICD-10-CM

## 2020-09-09 DIAGNOSIS — M5416 Radiculopathy, lumbar region: Secondary | ICD-10-CM

## 2020-09-09 MED ORDER — METHYLPREDNISOLONE ACETATE 80 MG/ML IJ SUSP
80.0000 mg | Freq: Once | INTRAMUSCULAR | Status: AC
  Administered 2020-09-09: 80 mg

## 2020-09-09 NOTE — Progress Notes (Signed)
Pt state lower back pain that travels to both hips and legs. Pt state walking and standing makes the pain worse. Pt state he takes pain meds to help ease his pain.  Numeric Pain Rating Scale and Functional Assessment Average Pain 5   In the last MONTH (on 0-10 scale) has pain interfered with the following?  1. General activity like being  able to carry out your everyday physical activities such as walking, climbing stairs, carrying groceries, or moving a chair?  Rating(8)   +Driver, -BT, -Dye Allergies.

## 2020-09-10 NOTE — Procedures (Signed)
Lumbosacral Transforaminal Epidural Steroid Injection - Sub-Pedicular Approach with Fluoroscopic Guidance  Patient: Juan Kennedy      Date of Birth: [birth year] MRN: [id number] PCP: London Pepper, MD      Visit Date: 09/09/2020   Universal Protocol:    Date/Time: 09/09/2020  Consent Given By: the patient  Position: PRONE  Additional Comments: Vital signs were monitored before and after the procedure. Patient was prepped and draped in the usual sterile fashion. The correct patient, procedure, and site was verified.   Injection Procedure Details:   Procedure diagnoses: Lumbar radiculopathy [M54.16]    Meds Administered:  Meds ordered this encounter  Medications   methylPREDNISolone acetate (DEPO-MEDROL) injection 80 mg    Laterality: Bilateral  Location/Site:  L3-L4  Needle:5.0 in., 22 ga.  Short bevel or Quincke spinal needle  Needle Placement: Transforaminal  Findings:    -Comments: Excellent flow of contrast along the nerve, nerve root and into the epidural space.  Procedure Details: After squaring off the end-plates to get a true AP view, the C-arm was positioned so that an oblique view of the foramen as noted above was visualized. The target area is just inferior to the "nose of the scotty dog" or sub pedicular. The soft tissues overlying this structure were infiltrated with 2-3 ml. of 1% Lidocaine without Epinephrine.  The spinal needle was inserted toward the target using a "trajectory" view along the fluoroscope beam.  Under AP and lateral visualization, the needle was advanced so it did not puncture dura and was located close the 6 O'Clock position of the pedical in AP tracterory. Biplanar projections were used to confirm position. Aspiration was confirmed to be negative for CSF and/or blood. A 1-2 ml. volume of Isovue-250 was injected and flow of contrast was noted at each level. Radiographs were obtained for documentation purposes.   After attaining the  desired flow of contrast documented above, a 0.5 to 1.0 ml test dose of 0.25% Marcaine was injected into each respective transforaminal space.  The patient was observed for 90 seconds post injection.  After no sensory deficits were reported, and normal lower extremity motor function was noted,   the above injectate was administered so that equal amounts of the injectate were placed at each foramen (level) into the transforaminal epidural space.   Additional Comments:  No complications occurred Dressing: 2 x 2 sterile gauze and Band-Aid    Post-procedure details: Patient was observed during the procedure. Post-procedure instructions were reviewed.  Patient left the clinic in stable condition.

## 2020-09-10 NOTE — Progress Notes (Signed)
Juan Kennedy - 80 y.o. male MRN [id number]  Date of birth: 1940-10-11  Office Visit Note: Visit Date: 09/09/2020 PCP: London Pepper, MD Referred by: London Pepper, MD  Subjective: Chief Complaint  Patient presents with   Lower Back - Pain   Right Hip - Pain   Left Hip - Pain   Left Leg - Pain   Right Leg - Pain   HPI:  Juan Kennedy is a 80 y.o. male who comes in today for planned Bilateral L3-L4 Lumbar Transforaminal epidural steroid injection with fluoroscopic guidance.  The patient has failed conservative care including home exercise, medications, time and activity modification.  This injection will be diagnostic and hopefully therapeutic.  Please see requesting physician notes for further details and justification.  He follows up today for MRI review as well as injection.  MRIs reviewed with the patient today using spine models and imaging.  He has fairly severe stenosis multifactorial 3-4.  Facet arthritis at that level and L4-5.  History of significant facet arthritis over the year but new MRI shows pretty significant changes in stenosis at the L3-4 level.  We did compare this to 2014 to show him those changes.  His spine otherwise looks fairly well with good disc height and hydration for his age as well as no listhesis or curvature.  He probably would do well with 1 level decompression depending on injection therapy.  ROS Otherwise per HPI.  Assessment & Plan: Visit Diagnoses:    ICD-10-CM   1. Lumbar radiculopathy  M54.16 XR C-ARM NO REPORT    Epidural Steroid injection    methylPREDNISolone acetate (DEPO-MEDROL) injection 80 mg    2. Spinal stenosis of lumbar region with neurogenic claudication  M48.062 XR C-ARM NO REPORT    Epidural Steroid injection    methylPREDNISolone acetate (DEPO-MEDROL) injection 80 mg      Plan: No additional findings.   Meds & Orders:  Meds ordered this encounter  Medications   methylPREDNISolone acetate (DEPO-MEDROL) injection 80  mg    Orders Placed This Encounter  Procedures   XR C-ARM NO REPORT   Epidural Steroid injection    Follow-up: Return if symptoms worsen or fail to improve.   Procedures: No procedures performed  Lumbosacral Transforaminal Epidural Steroid Injection - Sub-Pedicular Approach with Fluoroscopic Guidance  Patient: Juan Kennedy      Date of Birth: [birth year] MRN: [id number] PCP: London Pepper, MD      Visit Date: 09/09/2020   Universal Protocol:    Date/Time: 09/09/2020  Consent Given By: the patient  Position: PRONE  Additional Comments: Vital signs were monitored before and after the procedure. Patient was prepped and draped in the usual sterile fashion. The correct patient, procedure, and site was verified.   Injection Procedure Details:   Procedure diagnoses: Lumbar radiculopathy [M54.16]    Meds Administered:  Meds ordered this encounter  Medications   methylPREDNISolone acetate (DEPO-MEDROL) injection 80 mg    Laterality: Bilateral  Location/Site:  L3-L4  Needle:5.0 in., 22 ga.  Short bevel or Quincke spinal needle  Needle Placement: Transforaminal  Findings:    -Comments: Excellent flow of contrast along the nerve, nerve root and into the epidural space.  Procedure Details: After squaring off the end-plates to get a true AP view, the C-arm was positioned so that an oblique view of the foramen as noted above was visualized. The target area is just inferior to the "nose of the scotty dog" or sub pedicular. The soft tissues  overlying this structure were infiltrated with 2-3 ml. of 1% Lidocaine without Epinephrine.  The spinal needle was inserted toward the target using a "trajectory" view along the fluoroscope beam.  Under AP and lateral visualization, the needle was advanced so it did not puncture dura and was located close the 6 O'Clock position of the pedical in AP tracterory. Biplanar projections were used to confirm position. Aspiration was confirmed  to be negative for CSF and/or blood. A 1-2 ml. volume of Isovue-250 was injected and flow of contrast was noted at each level. Radiographs were obtained for documentation purposes.   After attaining the desired flow of contrast documented above, a 0.5 to 1.0 ml test dose of 0.25% Marcaine was injected into each respective transforaminal space.  The patient was observed for 90 seconds post injection.  After no sensory deficits were reported, and normal lower extremity motor function was noted,   the above injectate was administered so that equal amounts of the injectate were placed at each foramen (level) into the transforaminal epidural space.   Additional Comments:  No complications occurred Dressing: 2 x 2 sterile gauze and Band-Aid    Post-procedure details: Patient was observed during the procedure. Post-procedure instructions were reviewed.  Patient left the clinic in stable condition.     Clinical History: MRI LUMBAR SPINE WITHOUT CONTRAST   TECHNIQUE: Multiplanar, multisequence MR imaging of the lumbar spine was performed. No intravenous contrast was administered.   COMPARISON:  11/05/2012 MRI.  07/24/2020 radiography.   FINDINGS: Segmentation:  5 lumbar type vertebral bodies.   Alignment:  2 mm degenerative anterolisthesis L3-4 and L4-5.   Vertebrae:  No fracture or focal bone lesion.   Conus medullaris and cauda equina: Conus extends to the T12-L1 level. Conus and cauda equina appear normal.   Paraspinal and other soft tissues: Negative   Disc levels:   L1-2: Minimal disc bulge.  No stenosis.   L2-3: Mild to moderate bulging of the disc. Mild facet and ligamentous hypertrophy. Mild stenosis of both lateral recesses without definite neural compression. Mild worsening since 2014.   L3-4: Chronic facet arthropathy with 2 mm of anterolisthesis. Shallow protrusion of the disc. Severe multifactorial stenosis at this level that could cause neural compression on  either or both sides. The appearance could worsen with standing or flexion. Findings have worsened since 2014.   L4-5: Bilateral facet arthropathy with 2 mm of anterolisthesis. Moderate bulging of the disc. Stenosis of the subarticular lateral recesses that could cause neural compression on either or both sides. The appearance could worsen with standing or flexion. Synovial cyst previously seen on the right in 2014 is no longer present.   L5-S1: Minimal bulging of the disc.  No canal or foraminal stenosis.   IMPRESSION: L3-4: Severe multifactorial spinal stenosis that could cause neural compression on either or both sides. Bilateral facet arthropathy with 2 mm of anterolisthesis. Shallow protrusion of the disc. Findings at this level could worsen with standing or flexion. Findings have worsened since 2014.   L4-5: Bilateral facet arthropathy with 2 mm of anterolisthesis. Moderate bulging of the disc. Stenosis of both lateral recesses that could cause neural compression. The appearance could worsen with standing or flexion based on the morphology of the facet arthropathy. Synovial cyst seen on the right associated with the facet in 2014 is no longer present     Electronically Signed   By: Nelson Chimes M.D.   On: 08/19/2020 11:44     Objective:  VS:  HT:  WT:   BMI:     BP:124/78  HR:82bpm  TEMP: ( )  RESP:  Physical Exam Vitals and nursing note reviewed.  Constitutional:      General: He is not in acute distress.    Appearance: Normal appearance. He is not ill-appearing.  HENT:     Head: Normocephalic and atraumatic.     Right Ear: External ear normal.     Left Ear: External ear normal.     Nose: No congestion.  Eyes:     Extraocular Movements: Extraocular movements intact.  Cardiovascular:     Rate and Rhythm: Normal rate.     Pulses: Normal pulses.  Pulmonary:     Effort: Pulmonary effort is normal. No respiratory distress.  Abdominal:     General: There  is no distension.     Palpations: Abdomen is soft.  Musculoskeletal:        General: No tenderness or signs of injury.     Cervical back: Neck supple.     Right lower leg: No edema.     Left lower leg: No edema.     Comments: Patient has good distal strength without clonus.  Skin:    Findings: No erythema or rash.  Neurological:     General: No focal deficit present.     Mental Status: He is alert and oriented to person, place, and time.     Sensory: No sensory deficit.     Motor: No weakness or abnormal muscle tone.     Coordination: Coordination normal.  Psychiatric:        Mood and Affect: Mood normal.        Behavior: Behavior normal.     Imaging: XR C-ARM NO REPORT  Result Date: 09/09/2020 Please see Notes tab for imaging impression.

## 2020-09-17 ENCOUNTER — Other Ambulatory Visit: Payer: Self-pay | Admitting: Surgery

## 2020-09-17 DIAGNOSIS — K4090 Unilateral inguinal hernia, without obstruction or gangrene, not specified as recurrent: Secondary | ICD-10-CM | POA: Diagnosis not present

## 2020-09-17 DIAGNOSIS — K429 Umbilical hernia without obstruction or gangrene: Secondary | ICD-10-CM | POA: Diagnosis not present

## 2020-10-15 ENCOUNTER — Encounter: Payer: Self-pay | Admitting: Physical Medicine and Rehabilitation

## 2020-10-15 DIAGNOSIS — M5416 Radiculopathy, lumbar region: Secondary | ICD-10-CM

## 2020-10-15 DIAGNOSIS — M48062 Spinal stenosis, lumbar region with neurogenic claudication: Secondary | ICD-10-CM

## 2020-10-24 DIAGNOSIS — Z23 Encounter for immunization: Secondary | ICD-10-CM | POA: Diagnosis not present

## 2020-11-07 DIAGNOSIS — M48062 Spinal stenosis, lumbar region with neurogenic claudication: Secondary | ICD-10-CM | POA: Diagnosis not present

## 2020-11-07 DIAGNOSIS — M4316 Spondylolisthesis, lumbar region: Secondary | ICD-10-CM | POA: Diagnosis not present

## 2020-11-18 DIAGNOSIS — J3489 Other specified disorders of nose and nasal sinuses: Secondary | ICD-10-CM | POA: Diagnosis not present

## 2020-11-18 DIAGNOSIS — K5900 Constipation, unspecified: Secondary | ICD-10-CM | POA: Diagnosis not present

## 2020-11-18 DIAGNOSIS — L309 Dermatitis, unspecified: Secondary | ICD-10-CM | POA: Diagnosis not present

## 2020-11-18 DIAGNOSIS — M436 Torticollis: Secondary | ICD-10-CM | POA: Diagnosis not present

## 2020-11-18 DIAGNOSIS — E785 Hyperlipidemia, unspecified: Secondary | ICD-10-CM | POA: Diagnosis not present

## 2020-11-18 DIAGNOSIS — M549 Dorsalgia, unspecified: Secondary | ICD-10-CM | POA: Diagnosis not present

## 2020-11-18 DIAGNOSIS — R972 Elevated prostate specific antigen [PSA]: Secondary | ICD-10-CM | POA: Diagnosis not present

## 2020-11-18 DIAGNOSIS — K219 Gastro-esophageal reflux disease without esophagitis: Secondary | ICD-10-CM | POA: Diagnosis not present

## 2020-11-28 DIAGNOSIS — M4316 Spondylolisthesis, lumbar region: Secondary | ICD-10-CM | POA: Diagnosis not present

## 2021-01-13 DIAGNOSIS — R972 Elevated prostate specific antigen [PSA]: Secondary | ICD-10-CM | POA: Diagnosis not present

## 2021-01-16 DIAGNOSIS — N138 Other obstructive and reflux uropathy: Secondary | ICD-10-CM | POA: Diagnosis not present

## 2021-01-16 DIAGNOSIS — N529 Male erectile dysfunction, unspecified: Secondary | ICD-10-CM | POA: Diagnosis not present

## 2021-01-16 DIAGNOSIS — R972 Elevated prostate specific antigen [PSA]: Secondary | ICD-10-CM | POA: Diagnosis not present

## 2021-01-16 DIAGNOSIS — N401 Enlarged prostate with lower urinary tract symptoms: Secondary | ICD-10-CM | POA: Diagnosis not present

## 2021-02-28 DIAGNOSIS — Z79899 Other long term (current) drug therapy: Secondary | ICD-10-CM | POA: Diagnosis not present

## 2021-02-28 DIAGNOSIS — G243 Spasmodic torticollis: Secondary | ICD-10-CM | POA: Diagnosis not present

## 2021-03-19 DIAGNOSIS — M25511 Pain in right shoulder: Secondary | ICD-10-CM | POA: Diagnosis not present

## 2021-03-19 DIAGNOSIS — M542 Cervicalgia: Secondary | ICD-10-CM | POA: Diagnosis not present

## 2021-05-14 DIAGNOSIS — H04123 Dry eye syndrome of bilateral lacrimal glands: Secondary | ICD-10-CM | POA: Diagnosis not present

## 2021-05-14 DIAGNOSIS — H35372 Puckering of macula, left eye: Secondary | ICD-10-CM | POA: Diagnosis not present

## 2021-05-14 DIAGNOSIS — H26492 Other secondary cataract, left eye: Secondary | ICD-10-CM | POA: Diagnosis not present

## 2021-05-14 DIAGNOSIS — H43812 Vitreous degeneration, left eye: Secondary | ICD-10-CM | POA: Diagnosis not present

## 2021-05-23 DIAGNOSIS — Z0000 Encounter for general adult medical examination without abnormal findings: Secondary | ICD-10-CM | POA: Diagnosis not present

## 2021-05-23 DIAGNOSIS — R7309 Other abnormal glucose: Secondary | ICD-10-CM | POA: Diagnosis not present

## 2021-05-23 DIAGNOSIS — E785 Hyperlipidemia, unspecified: Secondary | ICD-10-CM | POA: Diagnosis not present

## 2021-05-23 DIAGNOSIS — D696 Thrombocytopenia, unspecified: Secondary | ICD-10-CM | POA: Diagnosis not present

## 2021-05-27 DIAGNOSIS — R7309 Other abnormal glucose: Secondary | ICD-10-CM | POA: Diagnosis not present

## 2021-05-27 DIAGNOSIS — R972 Elevated prostate specific antigen [PSA]: Secondary | ICD-10-CM | POA: Diagnosis not present

## 2021-05-27 DIAGNOSIS — M436 Torticollis: Secondary | ICD-10-CM | POA: Diagnosis not present

## 2021-05-27 DIAGNOSIS — K219 Gastro-esophageal reflux disease without esophagitis: Secondary | ICD-10-CM | POA: Diagnosis not present

## 2021-05-27 DIAGNOSIS — D696 Thrombocytopenia, unspecified: Secondary | ICD-10-CM | POA: Diagnosis not present

## 2021-05-27 DIAGNOSIS — E785 Hyperlipidemia, unspecified: Secondary | ICD-10-CM | POA: Diagnosis not present

## 2021-05-27 DIAGNOSIS — N400 Enlarged prostate without lower urinary tract symptoms: Secondary | ICD-10-CM | POA: Diagnosis not present

## 2021-05-27 DIAGNOSIS — Z0000 Encounter for general adult medical examination without abnormal findings: Secondary | ICD-10-CM | POA: Diagnosis not present

## 2021-05-27 DIAGNOSIS — J309 Allergic rhinitis, unspecified: Secondary | ICD-10-CM | POA: Diagnosis not present

## 2021-06-03 ENCOUNTER — Ambulatory Visit: Payer: Medicare HMO | Attending: Family Medicine

## 2021-06-03 DIAGNOSIS — M436 Torticollis: Secondary | ICD-10-CM | POA: Diagnosis not present

## 2021-06-03 DIAGNOSIS — R252 Cramp and spasm: Secondary | ICD-10-CM

## 2021-06-03 DIAGNOSIS — R293 Abnormal posture: Secondary | ICD-10-CM

## 2021-06-03 DIAGNOSIS — M542 Cervicalgia: Secondary | ICD-10-CM

## 2021-06-03 NOTE — Therapy (Signed)
?OUTPATIENT PHYSICAL THERAPY CERVICAL EVALUATION ? ? ?Patient Name: Juan Kennedy ?MRN: [id number] ?DOB:11-03-1940, 81 y.o., male ?Today's Date: 06/03/2021 ? ? PT End of Session - 06/03/21 1410   ? ? Visit Number 1   ? Date for PT Re-Evaluation 07/29/21   ? Authorization Type [employer]   ? PT Start Time 1409   ? PT Stop Time 2025   ? PT Time Calculation (min) 43 min   ? Activity Tolerance Patient tolerated treatment well   ? Behavior During Therapy [facility] for tasks assessed/performed   ? ?  ?  ? ?  ? ? ?Past Medical History:  ?Diagnosis Date  ? Allergy   ? Anemia   ? in past  ? Arthritis   ? OA  ? BPH (benign prostatic hyperplasia)   ? GERD (gastroesophageal reflux disease)   ? High cholesterol   ? Reflux   ? ?Past Surgical History:  ?Procedure Laterality Date  ? COLONOSCOPY    ? HEMORRHOID SURGERY    ? ROTATOR CUFF REPAIR Right   ? SHOULDER SURGERY Left   ? ?Patient Active Problem List  ? Diagnosis Date Noted  ? Rectal bleeding 03/02/2019  ? Anal fissure 03/02/2019  ? Cervicalgia 09/07/2016  ? Cervical spondylosis without myelopathy 09/07/2016  ? Myofascitis 09/07/2016  ? Torticollis 09/07/2016  ? ? ?PCP: London Pepper, MD ? ?REFERRING PROVIDER: London Pepper, MD ? ?REFERRING DIAG: M43.6 (ICD-10-CM) - Torticollis  ? ?THERAPY DIAG:  ?Cervicalgia ? ?Abnormal posture ? ?Cramp and spasm ? ?ONSET DATE: 06/02/2021 ? ?SUBJECTIVE:                                                                                                                                                                                                        ? 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  He has had Botox and PT several times in the past even as recent as 8-10 months ago.  He is seeing a neurologist that is treating him but he is at a point where he sees her once per year.  He is taking several meds to help with spasm and abnormal movement patterns.  He is hoping to get some relief  from this most recent exacerbation and possibly a new perspective or new approach to his condition.   ? ?PERTINENT HISTORY:  ?See above ? ?PAIN:  ?Are you having pain? Yes: NPRS scale: 6/10 ?Pain location: Right side of neck at [facility] ?Pain description: aching ?Aggravating factors: stress, not redirecting his attention ?Relieving  factors: meds, singing, talking, reading out loud ? ?PRECAUTIONS: None ? ?WEIGHT BEARING RESTRICTIONS No ? ?FALLS:  ?Has patient fallen in last 6 months? No ? ?LIVING ENVIRONMENT: ?Lives with: lives with their spouse ?Lives in: House/apartment ? ? ?OCCUPATION: Retired ? ?PLOF: Independent ? ?PATIENT GOALS He is hoping to get some relief from this most recent exacerbation and possibly a new perspective or new approach to his condition.   ? ?OBJECTIVE:  ? ?DIAGNOSTIC FINDINGS: 06-18-2016 ?4 view cervical spine shows on AP multilevel facet arthropathy and  ?uncovertebral joint hypertrophy right more than left. There are more  ?findings in the mid cervical region C5 to 6 and C6-7. There is some  ?rotatory component but otherwise normal AP alignment. Lateral view shows  ?good cervical lordosis with may be a tiny anterior listhesis of C4 on C5.  ?There are degenerative disc changes at C5-6 and C6-7. Oblique views show  ?some foraminal narrowing in the upper cervical spine at the C6-7 region. ? ?PATIENT SURVEYS:  ?FOTO 50 ? ? ?COGNITION: ?Overall cognitive status: Within functional limits for tasks assessed ? ? ?SENSATION: ?WFL ? ?POSTURE:  ?Cervical rotation to left with flexion on right simultaneously indicating spasmodic torticollis of right SCM ? ?PALPATION: ?Spasm and myofascial restriction bilateral upper traps and levator on right   ? ?CERVICAL ROM:  ? ?Active ROM A/PROM (deg) ?06/03/2021  ?Flexion WNL  ?Extension WNL when spasm settles  ?Right lateral flexion WNL when spasm settles  ?Left lateral flexion WNL when spasm settles  ?Right rotation 75% without spasm  ?Left rotation 75% without spasm   ? (Blank rows = not tested) ? ?UE ROM: ?WFL ? ? ?UE MMT: ? ?Right infraspinatus and supraspinatus weak in right shoulder.  Left shoulder and all other motions in right at 5/5.   ? ? ? ?PATIENT SURVEYS:  ?FOTO 50 (goal is 57) ? ?TODAY'S TREATMENT:  ?Initiated HEP, Initial evaluation completed ? ? ?PATIENT EDUCATION:  ?Education details: Initiated HEP, educated on dry needling benefits ?Person educated: Patient ?Education method: Explanation, Demonstration, Verbal cues, and Handouts ?Education comprehension: verbalized understanding, returned demonstration, and verbal cues required ? ? ?HOME EXERCISE PROGRAM: ?Access Code: [email or web address] ?URL: [email or web address] ?Date: 06/03/2021 ?Prepared by: Candyce Churn ? ?Exercises ?- Seated Cervical Retraction  - 1 x daily - 7 x weekly - 3 sets - 10 reps ?- Seated Neck Sidebending Stretch  - 1 x daily - 7 x weekly - 3 sets - 10 reps ?- Seated Cervical Rotation AROM  - 1 x daily - 7 x weekly - 3 sets - 10 reps ?- Sternocleidomastoid Stretch  - 1 x daily - 7 x weekly - 3 sets - 10 reps ? ?ASSESSMENT: ? ?CLINICAL IMPRESSION: ?Patient is a 81 y.o. male who was seen today for physical therapy evaluation and treatment for Torticollis. He has a long history of treatment including botox and PT with temporary relief each episode.  He presents with observable spasmodic torticollis, loss of cervical ROM, bilateral upper trapezius, parascapular and paraspinal spasm and myofascial restriction, along with right shoulder rotator cuff weakness.  He may respond well to DN and postural strengthening along with right shoulder strengthening.  He hopes to gain some relief from his current symptoms.   ? ? ?OBJECTIVE IMPAIRMENTS decreased ROM, decreased strength, hypomobility, increased fascial restrictions, increased muscle spasms, impaired flexibility, postural dysfunction, and pain.  ? ?ACTIVITY LIMITATIONS cleaning, community activity, driving, laundry, yard work, and shopping.   ? ?PERSONAL FACTORS Past/current experiences and Time since onset of  injury/illness/exacerbation are also affecting patient's functional outcome.  ? ? ?REHAB POTENTIAL: Fair Patient has had this condition since he was in his 20's ? ?CLINICAL DECISION MAKING: Stable/uncomplicated ? ?EVALUATION COMPLEXITY: Moderate ? ? ?GOALS: ?Goals reviewed with patient? Yes ? ?SHORT TERM GOALS: Target date: 07/01/2021 ? ?Patient will be independent with initial HEP  ?Baseline: na ?Goal status: INITIAL ? ?2.  Pain report to be no greater than 4/10  ?Baseline: 6 ?Goal status: INITIAL ? ? ?LONG TERM GOALS: Target date: 07/15/2021 ? ?Patient to be independent with advanced HEP  ?Baseline: na ?Goal status: INITIAL ? ?2.  Patient to report pain no greater than 2/10  ?Baseline: 6 ?Goal status: INITIAL ? ?3.  Right shoulder strength, supraspinatus and infraspinatus to be 5/5 ?Baseline: 4/5 ?Goal status: INITIAL ? ?4.  Patient to report 50% improvement in overall symptoms ?Baseline: na ?Goal status: INITIAL ? ? ? ?PLAN: ?PT FREQUENCY: 1-2x/week ? ?PT DURATION: 6 weeks ? ?PLANNED INTERVENTIONS: Therapeutic exercises, Therapeutic activity, Neuromuscular re-education, Patient/Family education, Joint mobilization, Dry Needling, Electrical stimulation, Spinal mobilization, Cryotherapy, Moist heat, Taping, Ultrasound, Biofeedback, and Manual therapy ? ?PLAN FOR NEXT SESSION: DN, postural exercises ? ? ?Isabel Caprice, PT ?06/03/2021, 3:19 PM ? ? ? ?  ?

## 2021-06-11 ENCOUNTER — Ambulatory Visit: Payer: Medicare HMO

## 2021-06-11 DIAGNOSIS — M542 Cervicalgia: Secondary | ICD-10-CM

## 2021-06-11 DIAGNOSIS — R252 Cramp and spasm: Secondary | ICD-10-CM

## 2021-06-11 DIAGNOSIS — M436 Torticollis: Secondary | ICD-10-CM | POA: Diagnosis not present

## 2021-06-11 DIAGNOSIS — R293 Abnormal posture: Secondary | ICD-10-CM

## 2021-06-11 NOTE — Therapy (Signed)
?OUTPATIENT PHYSICAL THERAPY TREATMENT NOTE ? ? ?Patient Name: Juan Kennedy ?MRN: [id number] ?DOB:03/11/1940, 81 y.o., male ?Today's Date: 06/11/2021 ? ?PCP: London Pepper, MD ?REFERRING PROVIDER: London Pepper, MD ? ?END OF SESSION:  ? PT End of Session - 06/11/21 1528   ? ? Visit Number 2   ? Date for PT Re-Evaluation 07/29/21   ? Authorization Type [employer]- 12 visits 06/03/21-07/29/21   ? Authorization - Visit Number 2   ? Authorization - Number of Visits 12   ? Progress Note Due on Visit 10   ? PT Start Time 1443   ? PT Stop Time 1529   ? PT Time Calculation (min) 46 min   ? Activity Tolerance Patient tolerated treatment well   ? Behavior During Therapy [facility] for tasks assessed/performed   ? ?  ?  ? ?  ? ? ?Past Medical History:  ?Diagnosis Date  ? Allergy   ? Anemia   ? in past  ? Arthritis   ? OA  ? BPH (benign prostatic hyperplasia)   ? GERD (gastroesophageal reflux disease)   ? High cholesterol   ? Reflux   ? ?Past Surgical History:  ?Procedure Laterality Date  ? COLONOSCOPY    ? HEMORRHOID SURGERY    ? ROTATOR CUFF REPAIR Right   ? SHOULDER SURGERY Left   ? ?Patient Active Problem List  ? Diagnosis Date Noted  ? Rectal bleeding 03/02/2019  ? Anal fissure 03/02/2019  ? Cervicalgia 09/07/2016  ? Cervical spondylosis without myelopathy 09/07/2016  ? Myofascitis 09/07/2016  ? Torticollis 09/07/2016  ? ? ?REFERRING DIAG: M43.6 (ICD-10-CM) - Torticollis  ? ?THERAPY DIAG:  ?Cervicalgia ? ?Abnormal posture ? ?Cramp and spasm ? 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 He has had Botox and PT several times in the past even as recent as 8-10 months ago.  He is seeing a neurologist that is treating him but he is at a point where he sees her once per year.  He is taking several meds to help with spasm and abnormal movement patterns.  He is hoping to get some relief from this most recent exacerbation and possibly a new perspective or new approach to his condition.    ?PRECAUTIONS: none  ? ?SUBJECTIVE: I'm working on the exercises.  They are difficulty.  ? ?PAIN:  ?Are you having pain? Yes: NPRS scale: 3-4/10 ?Pain location: Rt neck ?Pain description: sore ?Aggravating factors: moving head, stretching, focusing on something in front of me ?Relieving factors: heating pad ? ? ?OBJECTIVE: (objective measures completed at initial evaluation unless otherwise dated) ? ?DIAGNOSTIC FINDINGS: 06-18-2016 ?4 view cervical spine shows on AP multilevel facet arthropathy and  ?uncovertebral joint hypertrophy right more than left. There are more  ?findings in the mid cervical region C5 to 6 and C6-7. There is some  ?rotatory component but otherwise normal AP alignment. Lateral view shows  ?good cervical lordosis with may be a tiny anterior listhesis of C4 on C5.  ?There are degenerative disc changes at C5-6 and C6-7. Oblique views show  ?some foraminal narrowing in the upper cervical spine at the C6-7 region. ?  ?PATIENT SURVEYS:  ?FOTO 50 ? ?COGNITION: ?Overall cognitive status: Within functional limits for tasks assessed ?  ?  ?SENSATION: ?WFL ?  ?POSTURE:  ?Cervical rotation to left with flexion on right simultaneously indicating spasmodic torticollis of right SCM ?  ?PALPATION: ?Spasm and myofascial restriction bilateral upper traps and levator on right          ?  ?  CERVICAL ROM:  ?  ?Active ROM A/PROM (deg) ?06/03/2021  ?Flexion WNL  ?Extension WNL when spasm settles  ?Right lateral flexion WNL when spasm settles  ?Left lateral flexion WNL when spasm settles  ?Right rotation 75% without spasm  ?Left rotation 75% without spasm  ? (Blank rows = not tested) ?  ?UE ROM: ?WFL ?  ?  ?UE MMT: ?  ?Right infraspinatus and supraspinatus weak in right shoulder.  Left shoulder and all other motions in right at 5/5.   ?  ?  ?  ?PATIENT SURVEYS:  ?FOTO 50 (goal is 57) ?  ?TODAY'S TREATMENT:  ?Treatment on date:  ?Cervical retraction: 5" hold- pull to the Rt and into cervical ?Seated stretches- modified to  perform supine and pt was more successful with this.   ?Trigger Point Dry-Needling  ?Treatment instructions: Expect mild to moderate muscle soreness. S/S of pneumothorax if dry needled over a lung field, and to seek immediate medical attention should they occur. Patient verbalized understanding of these instructions and education. ? ?Patient Consent Given: Yes ?Education handout provided: Yes ?Muscles treated: Rt SCM, bil cervical multifidi, upper traps and suboccipitals ?Treatment response/outcome: twitch response and improved mobility  ? ?Skilled palpation and monitoring by PT during dry needling  ?Elongation and mobilization of neck muscles after DN ?  ?Initiated HEP, Initial evaluation completed ?  ?  ?PATIENT EDUCATION:  ?Education details: Initiated HEP, educated on dry needling benefits ?Person educated: Patient ?Education method: Explanation, Demonstration, Verbal cues, and Handouts ?Education comprehension: verbalized understanding, returned demonstration, and verbal cues required ?  ?  ?HOME EXERCISE PROGRAM: ?Access Code: [email or web address] ?URL: [email or web address] ?Date: 06/03/2021 ?Prepared by: Candyce Churn ?  ?Exercises ?- Seated Cervical Retraction  - 1 x daily - 7 x weekly - 3 sets - 10 reps ?- Seated Neck Sidebending Stretch  - 1 x daily - 7 x weekly - 3 sets - 10 reps ?- Seated Cervical Rotation AROM  - 1 x daily - 7 x weekly - 3 sets - 10 reps ?- Sternocleidomastoid Stretch  - 1 x daily - 7 x weekly - 3 sets - 10 reps ?  ?ASSESSMENT: ?  ?CLINICAL IMPRESSION: ?Pt with first time follow-up after evaluation.  Pt reports that exercises are very challenging at home. PT reviewed HEP and pt had a difficult time stretching away from the Rt side and is pulled to the Rt with retraction.  Pt tried these in supine and they were more manageable.  PT provided tactile and verbal cues for alignment.  Pt with tension and trigger points in bil neck Rt>Lt and had good response to DN with twitch response  and improved tissue mobility.  Pt will continue to benefit from skilled PT to address postural asymmetries and muscle tension.  ?  ?  ?OBJECTIVE IMPAIRMENTS decreased ROM, decreased strength, hypomobility, increased fascial restrictions, increased muscle spasms, impaired flexibility, postural dysfunction, and pain.  ?  ?ACTIVITY LIMITATIONS cleaning, community activity, driving, laundry, yard work, and shopping.  ?  ?PERSONAL FACTORS Past/current experiences and Time since onset of injury/illness/exacerbation are also affecting patient's functional outcome.  ?  ?  ?REHAB POTENTIAL: Fair Patient has had this condition since he was in his 20's ?  ?CLINICAL DECISION MAKING: Stable/uncomplicated ?  ?EVALUATION COMPLEXITY: Moderate ?  ?  ?GOALS: ?Goals reviewed with patient? Yes ?  ?SHORT TERM GOALS: Target date: 07/01/2021 ?  ?Patient will be independent with initial HEP  ?Baseline: na ?Goal status: INITIAL ?  ?2.  Pain report  to be no greater than 4/10  ?Baseline: 6 ?Goal status: INITIAL ?  ?  ?LONG TERM GOALS: Target date: 07/15/2021 ?  ?Patient to be independent with advanced HEP  ?Baseline: na ?Goal status: INITIAL ?  ?2.  Patient to report pain no greater than 2/10  ?Baseline: 6 ?Goal status: INITIAL ?  ?3.  Right shoulder strength, supraspinatus and infraspinatus to be 5/5 ?Baseline: 4/5 ?Goal status: INITIAL ?  ?4.  Patient to report 50% improvement in overall symptoms ?Baseline: na ?Goal status: INITIAL ?  ?  ?  ?PLAN: ?PT FREQUENCY: 1-2x/week ?  ?PT DURATION: 6 weeks ?  ?PLANNED INTERVENTIONS: Therapeutic exercises, Therapeutic activity, Neuromuscular re-education, Patient/Family education, Joint mobilization, Dry Needling, Electrical stimulation, Spinal mobilization, Cryotherapy, Moist heat, Taping, Ultrasound, Biofeedback, and Manual therapy ?  ?PLAN FOR NEXT SESSION: DN, postural exercises ? ? ? ? ?Sigurd Sos, PT ?06/11/21 3:31 PM  ? ?[facility] [address] ?[place], [place] [address] ?Phone # [phone] ?Fax [phone]  ?   ?

## 2021-06-18 ENCOUNTER — Ambulatory Visit: Payer: Medicare HMO

## 2021-06-18 DIAGNOSIS — R293 Abnormal posture: Secondary | ICD-10-CM

## 2021-06-18 DIAGNOSIS — M542 Cervicalgia: Secondary | ICD-10-CM

## 2021-06-18 DIAGNOSIS — M436 Torticollis: Secondary | ICD-10-CM | POA: Diagnosis not present

## 2021-06-18 DIAGNOSIS — R252 Cramp and spasm: Secondary | ICD-10-CM

## 2021-06-18 NOTE — Therapy (Signed)
?OUTPATIENT PHYSICAL THERAPY TREATMENT NOTE ? ? ?Patient Name: Juan Kennedy ?MRN: [id number] ?DOB:1940-02-23, 81 y.o., male ?Today's Date: 06/18/2021 ? ?PCP: London Pepper, MD ?REFERRING PROVIDER: London Pepper, MD ? ?END OF SESSION:  ? PT End of Session - 06/18/21 1656   ? ? Visit Number 3   ? Date for PT Re-Evaluation 07/29/21   ? Authorization Type [employer]- 12 visits 06/03/21-07/29/21   ? Authorization - Visit Number 3   ? Authorization - Number of Visits 12   ? Progress Note Due on Visit 10   ? PT Start Time 1611   ? PT Stop Time 1657   ? PT Time Calculation (min) 46 min   ? Activity Tolerance Patient tolerated treatment well   ? Behavior During Therapy [facility] for tasks assessed/performed   ? ?  ?  ? ?  ? ? ? ?Past Medical History:  ?Diagnosis Date  ? Allergy   ? Anemia   ? in past  ? Arthritis   ? OA  ? BPH (benign prostatic hyperplasia)   ? GERD (gastroesophageal reflux disease)   ? High cholesterol   ? Reflux   ? ?Past Surgical History:  ?Procedure Laterality Date  ? COLONOSCOPY    ? HEMORRHOID SURGERY    ? ROTATOR CUFF REPAIR Right   ? SHOULDER SURGERY Left   ? ?Patient Active Problem List  ? Diagnosis Date Noted  ? Rectal bleeding 03/02/2019  ? Anal fissure 03/02/2019  ? Cervicalgia 09/07/2016  ? Cervical spondylosis without myelopathy 09/07/2016  ? Myofascitis 09/07/2016  ? Torticollis 09/07/2016  ? ? ?REFERRING DIAG: M43.6 (ICD-10-CM) - Torticollis  ? ?THERAPY DIAG:  ?Cervicalgia ? ?Cramp and spasm ? ?Abnormal posture ? 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 He has had Botox and PT several times in the past even as recent as 8-10 months ago.  He is seeing a neurologist that is treating him but he is at a point where he sees her once per year.  He is taking several meds to help with spasm and abnormal movement patterns.  He is hoping to get some relief from this most recent exacerbation and possibly a new perspective or new approach to his condition.    ?PRECAUTIONS: none  ? ?SUBJECTIVE: I had a couple of hour of reduced neck tension after DN last session.  No significant change.   ? ?PAIN:  ?Are you having pain? Yes: NPRS scale: 3-4/10 ?Pain location: Rt neck ?Pain description: sore ?Aggravating factors: moving head, stretching, focusing on something in front of me ?Relieving factors: heating pad ? ? ?OBJECTIVE: (objective measures completed at initial evaluation unless otherwise dated) ? ?DIAGNOSTIC FINDINGS: 06-18-2016 ?4 view cervical spine shows on AP multilevel facet arthropathy and  ?uncovertebral joint hypertrophy right more than left. There are more  ?findings in the mid cervical region C5 to 6 and C6-7. There is some  ?rotatory component but otherwise normal AP alignment. Lateral view shows  ?good cervical lordosis with may be a tiny anterior listhesis of C4 on C5.  ?There are degenerative disc changes at C5-6 and C6-7. Oblique views show  ?some foraminal narrowing in the upper cervical spine at the C6-7 region. ?  ?PATIENT SURVEYS:  ?FOTO 50 ? ?COGNITION: ?Overall cognitive status: Within functional limits for tasks assessed ?   ?SENSATION: ?WFL ?  ?POSTURE:  ?Cervical rotation to left with flexion on right simultaneously indicating spasmodic torticollis of right SCM ?  ?PALPATION: ?Spasm and  myofascial restriction bilateral upper traps and levator on right          ?  ?CERVICAL ROM:  ?  ?Active ROM A/PROM (deg) ?06/03/2021  ?Flexion WNL  ?Extension WNL when spasm settles  ?Right lateral flexion WNL when spasm settles  ?Left lateral flexion WNL when spasm settles  ?Right rotation 75% without spasm  ?Left rotation 75% without spasm  ? (Blank rows = not tested) ?  ?UE ROM: ?WFL ?  ?  ?UE MMT: ?  ?Right infraspinatus and supraspinatus weak in right shoulder.  Left shoulder and all other motions in right at 5/5.   ?  ? ?PATIENT SURVEYS:  ?FOTO 50 (goal is 87) ?  ?TODAY'S TREATMENT:  ?reatment on date: 06/18/21 ?Seated ER, D2,  and horizontal abduction- yellow  band 2x10- focus on neutral head posture with these ?Trigger Point Dry-Needling  ?Treatment instructions: Expect mild to moderate muscle soreness. S/S of pneumothorax if dry needled over a lung field, and to seek immediate medical attention should they occur. Patient verbalized understanding of these instructions and education. ? ?Patient Consent Given: Yes ?Education handout provided: Yes ?Muscles treated: Rt SCM, bil cervical multifidi, upper traps and suboccipitals ?Treatment response/outcome: twitch response and improved mobility  ? ?Skilled palpation and monitoring by PT during dry needling  ?Elongation and mobilization of neck muscles after DN ? ?Treatment on date: 06/11/21 ?Cervical retraction: 5" hold- pull to the Rt and into cervical ?Seated stretches- modified to perform supine and pt was more successful with this.   ?Trigger Point Dry-Needling  ?Treatment instructions: Expect mild to moderate muscle soreness. S/S of pneumothorax if dry needled over a lung field, and to seek immediate medical attention should they occur. Patient verbalized understanding of these instructions and education. ? ?Patient Consent Given: Yes ?Education handout provided: Yes ?Muscles treated: Rt SCM, bil cervical multifidi, upper traps and suboccipitals ?Treatment response/outcome: twitch response and improved mobility  ? ?Skilled palpation and monitoring by PT during dry needling  ?Elongation and mobilization of neck muscles after DN ?  ?06/03/21: Initiated HEP, Initial evaluation completed ?  ?  ?PATIENT EDUCATION:  ?Education details: Initiated HEP, educated on dry needling benefits ?Person educated: Patient ?Education method: Explanation, Demonstration, Verbal cues, and Handouts ?Education comprehension: verbalized understanding, returned demonstration, and verbal cues required ?  ?  ?HOME EXERCISE PROGRAM: ?Access Code: [email or web address] ?URL: [email or web address] ?Date: 06/18/2021 ?Prepared by: Claiborne Billings ? ?Exercises ?-  Seated Cervical Retraction  - 1 x daily - 7 x weekly - 3 sets - 10 reps ?- Seated Neck Sidebending Stretch  - 1 x daily - 7 x weekly - 3 sets - 10 reps ?- Seated Cervical Rotation AROM  - 1 x daily - 7 x weekly - 3 sets - 10 reps ?- Sternocleidomastoid Stretch  - 1 x daily - 7 x weekly - 3 sets - 10 reps ?- Seated Shoulder Horizontal Abduction with Resistance  - 2 x daily - 7 x weekly - 2 sets - 10 reps ?- Seated Shoulder Diagonal with Resistance  - 1 x daily - 7 x weekly - 3 sets - 10 reps ?- Standing Shoulder External Rotation with Resistance  - 2 x daily - 7 x weekly - 2 sets - 10 reps ?ASSESSMENT: ?  ?CLINICAL IMPRESSION: ?Pt has been performing HEP in both sitting and supine.  Pt remains challenged with moving head to the Lt although motion to the Lt is improved overall.  Pt lifts his head more upright before beginning the motion  and this helps.  Pt did well with postural strength exercises and these were issued today. Focus of this is neutral head alignment with arm motion.  PT provided tactile and verbal cues for alignment and 1 set was performed in front of the mirror for visual.   Pt with tension and trigger points in bil neck Rt>Lt and had good response to DN with twitch response and improved tissue mobility.  Pt will continue to benefit from skilled PT to address postural asymmetries and muscle tension.  ?  ?  ?OBJECTIVE IMPAIRMENTS decreased ROM, decreased strength, hypomobility, increased fascial restrictions, increased muscle spasms, impaired flexibility, postural dysfunction, and pain.  ?  ?ACTIVITY LIMITATIONS cleaning, community activity, driving, laundry, yard work, and shopping.  ?  ?PERSONAL FACTORS Past/current experiences and Time since onset of injury/illness/exacerbation are also affecting patient's functional outcome.  ?  ?  ?REHAB POTENTIAL: Fair Patient has had this condition since he was in his 20's ?  ?CLINICAL DECISION MAKING: Stable/uncomplicated ?  ?EVALUATION COMPLEXITY: Moderate ?   ?  ?GOALS: ?Goals reviewed with patient? Yes ?  ?SHORT TERM GOALS: Target date: 07/01/2021 ?  ?Patient will be independent with initial HEP  ?Baseline: na ?Goal status: MET ?  ?2.  Pain report to be no gre

## 2021-06-24 ENCOUNTER — Ambulatory Visit: Payer: Medicare HMO

## 2021-06-24 DIAGNOSIS — M436 Torticollis: Secondary | ICD-10-CM | POA: Diagnosis not present

## 2021-06-24 DIAGNOSIS — M542 Cervicalgia: Secondary | ICD-10-CM

## 2021-06-24 DIAGNOSIS — R293 Abnormal posture: Secondary | ICD-10-CM

## 2021-06-24 DIAGNOSIS — R252 Cramp and spasm: Secondary | ICD-10-CM

## 2021-06-24 NOTE — Therapy (Signed)
OUTPATIENT PHYSICAL THERAPY TREATMENT NOTE   Patient Name: Juan Kennedy MRN: [id number] DOB:05-01-1940, 81 y.o., male Today's Date: 06/24/2021  PCP: London Pepper, MD REFERRING PROVIDER: London Pepper, MD  END OF SESSION:   PT End of Session - 06/24/21 1316     Visit Number 4    Date for PT Re-Evaluation 07/29/21    Authorization Type [employer]- 12 visits 06/03/21-07/29/21    Authorization - Visit Number 4    Authorization - Number of Visits 12    Progress Note Due on Visit 10    PT Start Time 1232    PT Stop Time 2706    PT Time Calculation (min) 45 min    Activity Tolerance Patient tolerated treatment well    Behavior During Therapy WFL for tasks assessed/performed               Past Medical History:  Diagnosis Date   Allergy    Anemia    in past   Arthritis    OA   BPH (benign prostatic hyperplasia)    GERD (gastroesophageal reflux disease)    High cholesterol    Reflux    Past Surgical History:  Procedure Laterality Date   COLONOSCOPY     HEMORRHOID SURGERY     ROTATOR CUFF REPAIR Right    SHOULDER SURGERY Left    Patient Active Problem List   Diagnosis Date Noted   Rectal bleeding 03/02/2019   Anal fissure 03/02/2019   Cervicalgia 09/07/2016   Cervical spondylosis without myelopathy 09/07/2016   Myofascitis 09/07/2016   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 He has had Botox and PT several times in the past even as recent as 8-10 months ago.  He is seeing a neurologist that is treating him but he is at a point where he sees her once per year.  He is taking several meds to help with spasm and abnormal movement patterns.  He is hoping to get some relief from this most recent exacerbation and possibly a new perspective or new approach to his  condition.   PRECAUTIONS: none   SUBJECTIVE: The exercises are a challenge.  They make the Rt side of my neck hurt.    PAIN:  Are you having pain? Yes: NPRS scale: 3-4/10 Pain location: Rt neck Pain description: sore Aggravating factors: moving head, stretching, focusing on something in front of me Relieving factors: heating pad   OBJECTIVE: (objective measures completed at initial evaluation unless otherwise dated)  DIAGNOSTIC FINDINGS: 06-18-2016 4 view cervical spine shows on AP multilevel facet arthropathy and  uncovertebral joint hypertrophy right more than left. There are more  findings in the mid cervical region C5 to 6 and C6-7. There is some  rotatory component but otherwise normal AP alignment. Lateral view shows  good cervical lordosis with may be a tiny anterior listhesis of C4 on C5.  There are degenerative disc changes at C5-6 and C6-7. Oblique views show  some foraminal narrowing in the upper cervical spine at the C6-7 region.   PATIENT SURVEYS:  FOTO 50  COGNITION: Overall cognitive status: Within functional limits for tasks assessed    SENSATION: WFL   POSTURE:  Cervical rotation to left with flexion on right simultaneously indicating spasmodic torticollis of right SCM   PALPATION: Spasm and myofascial restriction  bilateral upper traps and levator on right            CERVICAL ROM:    Active ROM A/PROM (deg) 06/03/2021  Flexion WNL  Extension WNL when spasm settles  Right lateral flexion WNL when spasm settles  Left lateral flexion WNL when spasm settles  Right rotation 75% without spasm  Left rotation 75% without spasm   (Blank rows = not tested)   UE ROM: WFL     UE MMT:  Right infraspinatus and supraspinatus weak in right shoulder.  Left shoulder and all other motions in right at 5/5.      PATIENT SURVEYS:  FOTO 50 (goal is 5)   TODAY'S TREATMENT:  Treatment on date: 06/24/21 3 ways raise: 1# 2x10 bil each with focus on neutral posture.    Arm bike: Level 1x 6 min (3/3)- PT present to discuss progress Trigger Point Dry-Needling  Treatment instructions: Expect mild to moderate muscle soreness. S/S of pneumothorax if dry needled over a lung field, and to seek immediate medical attention should they occur. Patient verbalized understanding of these instructions and education.  Patient Consent Given: Yes Education handout provided: Yes Muscles treated: Rt SCM and UT (in supine)  Treatment response/outcome: twitch response and improved mobility   Skilled palpation and monitoring by PT during dry needling  Elongation and mobilization of neck muscles after DN  Treatment on date: 06/18/21 Seated ER, D2,  and horizontal abduction- yellow band 2x10- focus on neutral head posture with these Trigger Point Dry-Needling  Treatment instructions: Expect mild to moderate muscle soreness. S/S of pneumothorax if dry needled over a lung field, and to seek immediate medical attention should they occur. Patient verbalized understanding of these instructions and education.  Patient Consent Given: Yes Education handout provided: Yes Muscles treated: Rt SCM, bil cervical multifidi, upper traps and suboccipitals Treatment response/outcome: twitch response and improved mobility   Skilled palpation and monitoring by PT during dry needling  Elongation and mobilization of neck muscles after DN  Treatment on date: 06/11/21 Cervical retraction: 5" hold- pull to the Rt and into cervical Seated stretches- modified to perform supine and pt was more successful with this.   Trigger Point Dry-Needling  Treatment instructions: Expect mild to moderate muscle soreness. S/S of pneumothorax if dry needled over a lung field, and to seek immediate medical attention should they occur. Patient verbalized understanding of these instructions and education.  Patient Consent Given: Yes Education handout provided: Yes Muscles treated: Rt SCM, bil cervical multifidi,  upper traps and suboccipitals Treatment response/outcome: twitch response and improved mobility   Skilled palpation and monitoring by PT during dry needling  Elongation and mobilization of neck muscles after DN     PATIENT EDUCATION:  Education details: Initiated HEP, educated on dry needling benefits Person educated: Patient Education method: Consulting civil engineer, Media planner, Verbal cues, and Handouts Education comprehension: verbalized understanding, returned demonstration, and verbal cues required     HOME EXERCISE PROGRAM: Access Code: [email or web address] URL: [email or web address] Date: 06/18/2021 Prepared by: Claiborne Billings  Exercises - Seated Cervical Retraction  - 1 x daily - 7 x weekly - 3 sets - 10 reps - Seated Neck Sidebending Stretch  - 1 x daily - 7 x weekly - 3 sets - 10 reps - Seated Cervical Rotation AROM  - 1 x daily - 7 x weekly - 3 sets - 10 reps - Sternocleidomastoid Stretch  - 1 x daily - 7 x weekly - 3 sets - 10 reps - Seated Shoulder Horizontal Abduction  with Resistance  - 2 x daily - 7 x weekly - 2 sets - 10 reps - Seated Shoulder Diagonal with Resistance  - 1 x daily - 7 x weekly - 3 sets - 10 reps - Standing Shoulder External Rotation with Resistance  - 2 x daily - 7 x weekly - 2 sets - 10 reps ASSESSMENT:   CLINICAL IMPRESSION: Pt is able to maintain improved neutral head posture when he is moving his arms on arm bike or with UE weights.  Rt Stenocleidomastoid muscle demonstrates less tension over the past 2 visits. Pt requires verbal cues for alignment in sitting.  Pt with tension in Rt SCM and UT and had good response to DN and manual therapy.  Pt will continue to benefit from skilled PT to address postural asymmetries and muscle tension.      OBJECTIVE IMPAIRMENTS decreased ROM, decreased strength, hypomobility, increased fascial restrictions, increased muscle spasms, impaired flexibility, postural dysfunction, and pain.    ACTIVITY LIMITATIONS cleaning,  community activity, driving, laundry, yard work, and shopping.    PERSONAL FACTORS Past/current experiences and Time since onset of injury/illness/exacerbation are also affecting patient's functional outcome.      REHAB POTENTIAL: Fair Patient has had this condition since he was in his 20's   CLINICAL DECISION MAKING: Stable/uncomplicated   EVALUATION COMPLEXITY: Moderate     GOALS: Goals reviewed with patient? Yes   SHORT TERM GOALS: Target date: 07/01/2021   Patient will be independent with initial HEP  Baseline: na Goal status: MET   2.  Pain report to be no greater than 4/10  Baseline: 6 Goal status: INITIAL     LONG TERM GOALS: Target date: 07/15/2021   Patient to be independent with advanced HEP  Baseline: na Goal status: INITIAL   2.  Patient to report pain no greater than 2/10  Baseline: 6 Goal status: INITIAL   3.  Right shoulder strength, supraspinatus and infraspinatus to be 5/5 Baseline: 4/5 Goal status: INITIAL   4.  Patient to report 50% improvement in overall symptoms Baseline: na Goal status: INITIAL       PLAN: PT FREQUENCY: 1-2x/week   PT DURATION: 6 weeks   PLANNED INTERVENTIONS: Therapeutic exercises, Therapeutic activity, Neuromuscular re-education, Patient/Family education, Joint mobilization, Dry Needling, Electrical stimulation, Spinal mobilization, Cryotherapy, Moist heat, Taping, Ultrasound, Biofeedback, and Manual therapy   PLAN FOR NEXT SESSION: DN, postural exercises, postural strength     Sigurd Sos, PT 06/24/21 1:19 PM   [facility] [address] [place], [place] [address] Phone # [phone] Fax [phone]

## 2021-07-01 ENCOUNTER — Ambulatory Visit: Payer: Medicare HMO

## 2021-07-01 DIAGNOSIS — R293 Abnormal posture: Secondary | ICD-10-CM

## 2021-07-01 DIAGNOSIS — M436 Torticollis: Secondary | ICD-10-CM | POA: Diagnosis not present

## 2021-07-01 DIAGNOSIS — R252 Cramp and spasm: Secondary | ICD-10-CM

## 2021-07-01 DIAGNOSIS — M542 Cervicalgia: Secondary | ICD-10-CM

## 2021-07-01 NOTE — Therapy (Signed)
OUTPATIENT PHYSICAL THERAPY TREATMENT NOTE   Patient Name: Juan Kennedy MRN: [id number] DOB:03/17/1940, 81 y.o., male Today's Date: 07/01/2021  PCP: London Pepper, MD REFERRING PROVIDER: London Pepper, MD  END OF SESSION:   PT End of Session - 07/01/21 1144     Visit Number 5    Date for PT Re-Evaluation 07/29/21    Authorization Type [employer]- 12 visits 06/03/21-07/29/21    Authorization - Visit Number 5    Authorization - Number of Visits 12    Progress Note Due on Visit 10    PT Start Time 1102    PT Stop Time 6384    PT Time Calculation (min) 42 min    Activity Tolerance Patient tolerated treatment well    Behavior During Therapy WFL for tasks assessed/performed                Past Medical History:  Diagnosis Date   Allergy    Anemia    in past   Arthritis    OA   BPH (benign prostatic hyperplasia)    GERD (gastroesophageal reflux disease)    High cholesterol    Reflux    Past Surgical History:  Procedure Laterality Date   COLONOSCOPY     HEMORRHOID SURGERY     ROTATOR CUFF REPAIR Right    SHOULDER SURGERY Left    Patient Active Problem List   Diagnosis Date Noted   Rectal bleeding 03/02/2019   Anal fissure 03/02/2019   Cervicalgia 09/07/2016   Cervical spondylosis without myelopathy 09/07/2016   Myofascitis 09/07/2016   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 He has had Botox and PT several times in the past even as recent as 8-10 months ago.  He is seeing a neurologist that is treating him but he is at a point where he sees her once per year.  He is taking several meds to help with spasm and abnormal movement patterns.  He is hoping to get some relief from this most recent exacerbation and possibly a new perspective or new approach to his  condition.   PRECAUTIONS: none   SUBJECTIVE: Things go pretty well with my exercises in the morning.  Things are more uncomfortable in the afternoons.   It is a challenge to turn head to the Lt.   PAIN:  Are you having pain? Yes: NPRS scale: 2-3/10 Pain location: Rt neck Pain description: sore, aching Aggravating factors: moving head, stretching, focusing on something in front of me Relieving factors: heating pad   OBJECTIVE: (objective measures completed at initial evaluation unless otherwise dated)  DIAGNOSTIC FINDINGS: 06-18-2016 4 view cervical spine shows on AP multilevel facet arthropathy and  uncovertebral joint hypertrophy right more than left. There are more  findings in the mid cervical region C5 to 6 and C6-7. There is some  rotatory component but otherwise normal AP alignment. Lateral view shows  good cervical lordosis with may be a tiny anterior listhesis of C4 on C5.  There are degenerative disc changes at C5-6 and C6-7. Oblique views show  some foraminal narrowing in the upper cervical spine at the C6-7 region.   PATIENT SURVEYS:  FOTO 50  COGNITION: Overall cognitive status: Within functional limits for tasks assessed    SENSATION: WFL   POSTURE:  Cervical rotation to left with flexion  on right simultaneously indicating spasmodic torticollis of right SCM   PALPATION: Spasm and myofascial restriction bilateral upper traps and levator on right            CERVICAL ROM:    Active ROM A/PROM (deg) 06/03/2021  Flexion WNL  Extension WNL when spasm settles  Right lateral flexion WNL when spasm settles  Left lateral flexion WNL when spasm settles  Right rotation 75% without spasm  Left rotation 75% without spasm   (Blank rows = not tested)   UE ROM: WFL     UE MMT:  Right infraspinatus and supraspinatus weak in right shoulder.  Left shoulder and all other motions in right at 5/5.      PATIENT SURVEYS:  FOTO 50 (goal is 75)   TODAY'S TREATMENT:   Treatment on date: 07/01/21 3 ways raise: 2# 2x10 bil each with focus on neutral posture.   Arm bike: Level 1x 6 min (3/3)- PT present to discuss progress Red band: bil shoulder extension and row 2x10  Manual: Elongation and mobilization of neck muscles with P/ROM to the Lt with stretch of SCM  Treatment on date: 06/24/21 3 ways raise: 1# 2x10 bil each with focus on neutral posture.   Arm bike: Level 1x 6 min (3/3)- PT present to discuss progress Trigger Point Dry-Needling  Treatment instructions: Expect mild to moderate muscle soreness. S/S of pneumothorax if dry needled over a lung field, and to seek immediate medical attention should they occur. Patient verbalized understanding of these instructions and education.  Patient Consent Given: Yes Education handout provided: Yes Muscles treated: Rt SCM and UT (in supine)  Treatment response/outcome: twitch response and improved mobility   Skilled palpation and monitoring by PT during dry needling  Elongation and mobilization of neck muscles after DN  Treatment on date: 06/18/21 Seated ER, D2,  and horizontal abduction- yellow band 2x10- focus on neutral head posture with these Trigger Point Dry-Needling  Treatment instructions: Expect mild to moderate muscle soreness. S/S of pneumothorax if dry needled over a lung field, and to seek immediate medical attention should they occur. Patient verbalized understanding of these instructions and education.  Patient Consent Given: Yes Education handout provided: Yes Muscles treated: Rt SCM, bil cervical multifidi, upper traps and suboccipitals Treatment response/outcome: twitch response and improved mobility   Skilled palpation and monitoring by PT during dry needling  Elongation and mobilization of neck muscles after DN  PATIENT EDUCATION:  Education details: Initiated HEP, educated on dry needling benefits Person educated: Patient Education method: Consulting civil engineer, Media planner, Verbal cues, and  Handouts Education comprehension: verbalized understanding, returned demonstration, and verbal cues required     HOME EXERCISE PROGRAM: Access Code: [email or web address] URL: [email or web address] Date: 06/18/2021 Prepared by: Claiborne Billings  Exercises - Seated Cervical Retraction  - 1 x daily - 7 x weekly - 3 sets - 10 reps - Seated Neck Sidebending Stretch  - 1 x daily - 7 x weekly - 3 sets - 10 reps - Seated Cervical Rotation AROM  - 1 x daily - 7 x weekly - 3 sets - 10 reps - Sternocleidomastoid Stretch  - 1 x daily - 7 x weekly - 3 sets - 10 reps - Seated Shoulder Horizontal Abduction with Resistance  - 2 x daily - 7 x weekly - 2 sets - 10 reps - Seated Shoulder Diagonal with Resistance  - 1 x daily - 7 x weekly - 3 sets - 10 reps - Standing Shoulder External Rotation with Resistance  - 2  x daily - 7 x weekly - 2 sets - 10 reps ASSESSMENT:   CLINICAL IMPRESSION: Pt reports that he hasn't noticed much change in symptoms since the start of care.  Afternoon time is harder now due to my muscles are fatigued.  Rt Stenocleidomastoid muscle demonstrates less tension over the past 2-3 visits. Pt requires verbal cues for alignment in sitting and to keep feet on the floor.  Pt with tension in Rt SCM and it was challenging to rotate head to the Lt.  Pt will continue to benefit from skilled PT to address postural asymmetries and muscle tension.      OBJECTIVE IMPAIRMENTS decreased ROM, decreased strength, hypomobility, increased fascial restrictions, increased muscle spasms, impaired flexibility, postural dysfunction, and pain.    ACTIVITY LIMITATIONS cleaning, community activity, driving, laundry, yard work, and shopping.    PERSONAL FACTORS Past/current experiences and Time since onset of injury/illness/exacerbation are also affecting patient's functional outcome.      REHAB POTENTIAL: Fair Patient has had this condition since he was in his 20's   CLINICAL DECISION MAKING: Stable/uncomplicated    EVALUATION COMPLEXITY: Moderate     GOALS: Goals reviewed with patient? Yes   SHORT TERM GOALS: Target date: 07/01/2021   Patient will be independent with initial HEP  Baseline:  Goal status: MET   2.  Pain report to be no greater than 4/10  Baseline: 4-5/10 (07/01/21) Goal status: In progress      LONG TERM GOALS: Target date: 07/15/2021   Patient to be independent with advanced HEP  Baseline:  Goal status: In progress   2.  Patient to report pain no greater than 2/10  Baseline: 6 Goal status: INITIAL   3.  Right shoulder strength, supraspinatus and infraspinatus to be 5/5 Baseline: 4/5 Goal status: INITIAL   4.  Patient to report 50% improvement in overall symptoms Baseline: not able to verbalize any changes (07/01/21) Goal status: In progress        PLAN: PT FREQUENCY: 1-2x/week   PT DURATION: 6 weeks   PLANNED INTERVENTIONS: Therapeutic exercises, Therapeutic activity, Neuromuscular re-education, Patient/Family education, Joint mobilization, Dry Needling, Electrical stimulation, Spinal mobilization, Cryotherapy, Moist heat, Taping, Ultrasound, Biofeedback, and Manual therapy   PLAN FOR NEXT SESSION: DN, postural exercises, postural strength.  Discuss D/C to HEP if no change in symptoms     Sigurd Sos, PT 07/01/21 11:44 AM   [facility] [address], [place], [place] [address] Phone # [phone] Fax [phone]

## 2021-07-09 ENCOUNTER — Ambulatory Visit: Payer: Medicare HMO | Attending: Family Medicine

## 2021-07-09 DIAGNOSIS — M542 Cervicalgia: Secondary | ICD-10-CM | POA: Diagnosis not present

## 2021-07-09 DIAGNOSIS — R252 Cramp and spasm: Secondary | ICD-10-CM | POA: Insufficient documentation

## 2021-07-09 DIAGNOSIS — R293 Abnormal posture: Secondary | ICD-10-CM | POA: Insufficient documentation

## 2021-07-09 NOTE — Therapy (Signed)
OUTPATIENT PHYSICAL THERAPY TREATMENT NOTE   Patient Name: Juan Kennedy MRN: [id number] DOB:February 23, 1940, 81 y.o., male Today's Date: 07/09/2021  PCP: London Pepper, MD REFERRING PROVIDER: London Pepper, MD  END OF SESSION:   PT End of Session - 07/09/21 1403     Visit Number 6    Date for PT Re-Evaluation 07/29/21    Authorization Type [employer]- 12 visits 06/03/21-07/29/21    Authorization - Visit Number 6    Authorization - Number of Visits 12    PT Start Time 1400    PT Stop Time 7106    PT Time Calculation (min) 43 min    Activity Tolerance Patient tolerated treatment well    Behavior During Therapy WFL for tasks assessed/performed                 Past Medical History:  Diagnosis Date   Allergy    Anemia    in past   Arthritis    OA   BPH (benign prostatic hyperplasia)    GERD (gastroesophageal reflux disease)    High cholesterol    Reflux    Past Surgical History:  Procedure Laterality Date   COLONOSCOPY     HEMORRHOID SURGERY     ROTATOR CUFF REPAIR Right    SHOULDER SURGERY Left    Patient Active Problem List   Diagnosis Date Noted   Rectal bleeding 03/02/2019   Anal fissure 03/02/2019   Cervicalgia 09/07/2016   Cervical spondylosis without myelopathy 09/07/2016   Myofascitis 09/07/2016   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 He has had Botox and PT several times in the past even as recent as 8-10 months ago.  He is seeing a neurologist that is treating him but he is at a point where he sees her once per year.  He is taking several meds to help with spasm and abnormal movement patterns.  He is hoping to get some relief from this most recent exacerbation and possibly a new perspective or new approach to his condition.   PRECAUTIONS: none    SUBJECTIVE: I have good days and bad days.  Looking over my shoulder is a challenge.    PAIN:  Pain increases to 6-7/10 with activity Are you having pain? Yes: NPRS scale: 2-3/10 Pain location: Rt neck Pain description: sore, aching Aggravating factors: moving head, stretching, focusing on something in front of me Relieving factors: heating pad   OBJECTIVE: (objective measures completed at initial evaluation unless otherwise dated)  DIAGNOSTIC FINDINGS: 06-18-2016 4 view cervical spine shows on AP multilevel facet arthropathy and  uncovertebral joint hypertrophy right more than left. There are more  findings in the mid cervical region C5 to 6 and C6-7. There is some  rotatory component but otherwise normal AP alignment. Lateral view shows  good cervical lordosis with may be a tiny anterior listhesis of C4 on C5.  There are degenerative disc changes at C5-6 and C6-7. Oblique views show  some foraminal narrowing in the upper cervical spine at the C6-7 region.   PATIENT SURVEYS:  FOTO 50  COGNITION: Overall cognitive status: Within functional limits for tasks assessed    SENSATION: WFL   POSTURE:  Cervical rotation to left with flexion on right simultaneously indicating spasmodic torticollis of right SCM   PALPATION: Spasm and myofascial restriction  bilateral upper traps and levator on right            CERVICAL ROM:    Active ROM A/PROM (deg) 06/03/2021  Flexion WNL  Extension WNL when spasm settles  Right lateral flexion WNL when spasm settles  Left lateral flexion WNL when spasm settles  Right rotation 75% without spasm  Left rotation 75% without spasm   (Blank rows = not tested)   UE ROM: WFL     UE MMT:  Right infraspinatus and supraspinatus weak in right shoulder.  Left shoulder and all other motions in right at 5/5.      PATIENT SURVEYS:  FOTO 50 (goal is 57) 07/09/21: 56    TODAY'S TREATMENT:   Treatment on date: 07/09/21 3 ways raise: 2# 2x10 bil each  with focus on neutral posture.   Arm bike: Level 1x 6 min (3/3)- PT present to discuss progress Red theraband: horizontal abduction, D2, ER 2x10 bil each Manual: Elongation and mobilization of neck muscles with P/ROM to the Lt with stretch of SCM Treatment on date: 07/01/21 3 ways raise: 2# 2x10 bil each with focus on neutral posture.   Arm bike: Level 1x 6 min (3/3)- PT present to discuss progress Red band: bil shoulder extension and row 2x10  Manual: Elongation and mobilization of neck muscles with P/ROM to the Lt with stretch of SCM  Treatment on date: 06/24/21 3 ways raise: 1# 2x10 bil each with focus on neutral posture.   Arm bike: Level 1x 6 min (3/3)- PT present to discuss progress Trigger Point Dry-Needling  Treatment instructions: Expect mild to moderate muscle soreness. S/S of pneumothorax if dry needled over a lung field, and to seek immediate medical attention should they occur. Patient verbalized understanding of these instructions and education.  Patient Consent Given: Yes Education handout provided: Yes Muscles treated: Rt SCM and UT (in supine)  Treatment response/outcome: twitch response and improved mobility   Skilled palpation and monitoring by PT during dry needling  Elongation and mobilization of neck muscles after DN  Treatment on date: 06/18/21 Seated ER, D2,  and horizontal abduction- yellow band 2x10- focus on neutral head posture with these Trigger Point Dry-Needling  Treatment instructions: Expect mild to moderate muscle soreness. S/S of pneumothorax if dry needled over a lung field, and to seek immediate medical attention should they occur. Patient verbalized understanding of these instructions and education.  Patient Consent Given: Yes Education handout provided: Yes Muscles treated: Rt SCM, bil cervical multifidi, upper traps and suboccipitals Treatment response/outcome: twitch response and improved mobility   Skilled palpation and monitoring by PT during  dry needling  Elongation and mobilization of neck muscles after DN  PATIENT EDUCATION:  Education details: Initiated HEP, educated on dry needling benefits Person educated: Patient Education method: Consulting civil engineer, Media planner, Verbal cues, and Handouts Education comprehension: verbalized understanding, returned demonstration, and verbal cues required     HOME EXERCISE PROGRAM: Access Code: [email or web address] URL: [email or web address] Date: 06/18/2021 Prepared by: Claiborne Billings  Exercises - Seated Cervical Retraction  - 1 x daily - 7 x weekly - 3 sets - 10 reps - Seated Neck Sidebending Stretch  - 1 x daily - 7 x weekly - 3 sets - 10 reps - Seated Cervical Rotation AROM  - 1 x daily - 7 x weekly - 3 sets - 10 reps - Sternocleidomastoid Stretch  - 1 x daily - 7 x weekly - 3 sets - 10 reps - Seated Shoulder Horizontal Abduction with Resistance  - 2  x daily - 7 x weekly - 2 sets - 10 reps - Seated Shoulder Diagonal with Resistance  - 1 x daily - 7 x weekly - 3 sets - 10 reps - Standing Shoulder External Rotation with Resistance  - 2 x daily - 7 x weekly - 2 sets - 10 reps ASSESSMENT:   CLINICAL IMPRESSION: Pt reports that he hasn't noticed much change in symptoms since the start of care.  Afternoon time is harder now due to being fatigued.  Rt Stenocleidomastoid muscle demonstrates less tension over the past 3-4 visits. Pt requires frequent verbal cues for alignment in sitting and to keep feet on the floor.  FOTO is improved to 56.  Pt does well with HEP and this allows him to hold his head more neutral while doing these exercises.  PT emphasized the importance of neutral seated posture and continuation of HEP for postural strength and alignment.  Pt will follow-up to discuss lack of change in symptoms.       OBJECTIVE IMPAIRMENTS decreased ROM, decreased strength, hypomobility, increased fascial restrictions, increased muscle spasms, impaired flexibility, postural dysfunction, and pain.     ACTIVITY LIMITATIONS cleaning, community activity, driving, laundry, yard work, and shopping.    PERSONAL FACTORS Past/current experiences and Time since onset of injury/illness/exacerbation are also affecting patient's functional outcome.      REHAB POTENTIAL: Fair Patient has had this condition since he was in his 20's   CLINICAL DECISION MAKING: Stable/uncomplicated   EVALUATION COMPLEXITY: Moderate     GOALS: Goals reviewed with patient? Yes   SHORT TERM GOALS: Target date: 07/01/2021   Patient will be independent with initial HEP  Baseline:  Goal status: MET   2.  Pain report to be no greater than 4/10  Baseline: 4-5/10 (07/01/21) Goal status: In progress      LONG TERM GOALS: Target date: 07/15/2021   Patient to be independent with advanced HEP  Baseline:  Goal status: MET   2.  Patient to report pain no greater than 2/10  Baseline: up to 6/10 with activity (07/09/21) Goal status: Not met   3.  Right shoulder strength, supraspinatus and infraspinatus to be 5/5 Baseline: suprispinatus 4/5, infraspinatus 4+/5 Goal status: partially met   4.  Patient to report 50% improvement in overall symptoms Baseline: not able to verbalize any changes (07/09/21) Goal status:Not met       PLAN: D/C PT to HEP.   PHYSICAL THERAPY DISCHARGE SUMMARY  Visits from Start of Care: 6  Current functional level related to goals / functional outcomes: See above for most current status.  Pt denies any change in his symptoms since the start of care.  Pt has HEP in place to address postural strength and flexibility.     Remaining deficits: Postural abnormality and muscle tone related torticollis.     Education / Equipment: HEP, posture    Patient agrees to discharge. Patient goals were not met. Patient is being discharged due to lack of progress.   Sigurd Sos, PT 07/09/21 2:46 PM   [facility] [address], [place], [place] [address] Phone  # [phone] Fax [phone]

## 2021-07-10 DIAGNOSIS — N401 Enlarged prostate with lower urinary tract symptoms: Secondary | ICD-10-CM | POA: Diagnosis not present

## 2021-07-10 DIAGNOSIS — N138 Other obstructive and reflux uropathy: Secondary | ICD-10-CM | POA: Diagnosis not present

## 2021-07-10 DIAGNOSIS — R972 Elevated prostate specific antigen [PSA]: Secondary | ICD-10-CM | POA: Diagnosis not present

## 2021-07-15 ENCOUNTER — Ambulatory Visit: Payer: Medicare HMO

## 2021-07-17 DIAGNOSIS — N529 Male erectile dysfunction, unspecified: Secondary | ICD-10-CM | POA: Diagnosis not present

## 2021-07-17 DIAGNOSIS — N401 Enlarged prostate with lower urinary tract symptoms: Secondary | ICD-10-CM | POA: Diagnosis not present

## 2021-07-17 DIAGNOSIS — N138 Other obstructive and reflux uropathy: Secondary | ICD-10-CM | POA: Diagnosis not present

## 2021-07-17 DIAGNOSIS — R972 Elevated prostate specific antigen [PSA]: Secondary | ICD-10-CM | POA: Diagnosis not present

## 2021-08-25 ENCOUNTER — Encounter: Payer: Self-pay | Admitting: Gastroenterology

## 2021-10-21 DIAGNOSIS — Z23 Encounter for immunization: Secondary | ICD-10-CM | POA: Diagnosis not present

## 2021-10-30 DIAGNOSIS — M542 Cervicalgia: Secondary | ICD-10-CM | POA: Diagnosis not present

## 2021-10-30 DIAGNOSIS — M19011 Primary osteoarthritis, right shoulder: Secondary | ICD-10-CM | POA: Diagnosis not present

## 2021-10-30 DIAGNOSIS — M25511 Pain in right shoulder: Secondary | ICD-10-CM | POA: Diagnosis not present

## 2021-11-06 DIAGNOSIS — M25511 Pain in right shoulder: Secondary | ICD-10-CM | POA: Diagnosis not present

## 2021-11-18 DIAGNOSIS — M25811 Other specified joint disorders, right shoulder: Secondary | ICD-10-CM | POA: Diagnosis not present

## 2021-11-28 DIAGNOSIS — R972 Elevated prostate specific antigen [PSA]: Secondary | ICD-10-CM | POA: Diagnosis not present

## 2021-11-28 DIAGNOSIS — K219 Gastro-esophageal reflux disease without esophagitis: Secondary | ICD-10-CM | POA: Diagnosis not present

## 2021-11-28 DIAGNOSIS — M549 Dorsalgia, unspecified: Secondary | ICD-10-CM | POA: Diagnosis not present

## 2021-11-28 DIAGNOSIS — K5900 Constipation, unspecified: Secondary | ICD-10-CM | POA: Diagnosis not present

## 2021-11-28 DIAGNOSIS — M436 Torticollis: Secondary | ICD-10-CM | POA: Diagnosis not present

## 2021-11-28 DIAGNOSIS — E785 Hyperlipidemia, unspecified: Secondary | ICD-10-CM | POA: Diagnosis not present

## 2022-02-03 ENCOUNTER — Encounter: Payer: Self-pay | Admitting: Gastroenterology

## 2022-02-03 ENCOUNTER — Ambulatory Visit: Payer: Medicare HMO | Admitting: Gastroenterology

## 2022-02-03 VITALS — BP 134/72 | HR 85 | Ht 68.0 in | Wt 158.2 lb

## 2022-02-03 DIAGNOSIS — Z86010 Personal history of colonic polyps: Secondary | ICD-10-CM

## 2022-02-03 DIAGNOSIS — K429 Umbilical hernia without obstruction or gangrene: Secondary | ICD-10-CM | POA: Diagnosis not present

## 2022-02-03 DIAGNOSIS — Z79899 Other long term (current) drug therapy: Secondary | ICD-10-CM

## 2022-02-03 DIAGNOSIS — K219 Gastro-esophageal reflux disease without esophagitis: Secondary | ICD-10-CM

## 2022-02-03 DIAGNOSIS — K602 Anal fissure, unspecified: Secondary | ICD-10-CM | POA: Diagnosis not present

## 2022-02-03 MED ORDER — AMBULATORY NON FORMULARY MEDICATION: 0 refills | Status: DC

## 2022-02-03 NOTE — Progress Notes (Signed)
HPI :  82 year old male with a history of torticollis, inguinal hernia, umbilical hernia, anal fissure, constipation, here for a follow-up visit.  He was last seen January 2021.  Since his last visit with [place] he has been taking MiraLAX for the past few years.  This is helped him move his bowels and he states it is effective to provide some regularity and avoid constipation.  He is also been using some fiber as well.  He does have history of anal fissures in the past, has been on topical nitroglycerin remotely, and some Desitin more recently.  He does have some discomfort in his anal canal with a bowel movement.  He is also been trying some Preparation H.  He denies any new bleeding symptoms.  He thinks the fissure is still active.  His last colonoscopy was in August 2020, he had 9 adenomas removed.  We discussed if he wanted to pursue any further colonoscopy exams.  He denies any cardiopulmonary symptoms and is doing well in that regard. No family history of colon cancer.  His main subjective concern is right inguinal hernia and periumbilical hernia.  He states these have been enlarging and can bother him, especially the right inguinal hernia.  He is scheduled to see general surgery next week about potentially fixing these.  He has held off on this in recent years however given worsening symptoms he is interested in having them repaired.  He denies any obstructive symptoms, he is eating okay.  He is never had a bowel obstruction before.  He otherwise has been taking Protonix 40 mg daily for reflux.  He states he has not tried a lower dose in a long time.  He denies any dysphagia.  He had an EGD in 2015 at another practice showing no Barrett's esophagus.  We discussed long-term risks of PPI.  He denies any history of osteoporosis or kidney problems.    Colonoscopy 09/22/18.  - Skin tags were found on perianal exam. - A 4 mm TA polyp was found in the cecum. The polyp was flat. The polyp was removed with  a   cold snare. Resection and retrieval were complete. - Five sessile and flat TA polyps were found in the ascending colon. The polyps were 3 to 4 mm   in size. These polyps were removed with a cold snare. Resection and retrieval were   complete. - Two sessileTA  polyps were found in the transverse colon. The polyps were 4 mm in size.   These polyps were removed with a cold snare. Resection and retrieval were complete. - A 3 mm TA polyp was found in the sigmoid colon. The polyp was sessile. The polyp was   removed with a cold snare. Resection and retrieval were complete. - Multiple medium-mouthed diverticula were found in the sigmoid colon. - Internal hemorrhoids were found during retroflexion. The hemorrhoids were small. - The exam was otherwise without abnormality. -Recall colonoscopy in 3 years if appropriate at the age of 89    Surgical [P], colon, cecal, ascending, transverse, sigmoid, polyp (9) - TUBULAR ADENOMA(S) - NEGATIVE FOR HIGH-GRADE DYSPLASIA OR MALIGNANCY  Colonoscopy 03/17/13 -  5 mm polyp in the descending colon. This is found to be adenomatous and patient was told to return in 5 years for follow-up. Moderate diverticulosis in the descending colon     EGD 03/17/13: by Dr. Jerene Pitch at [facility]. EGD showed normal stomach, normal duodenum, normal Z line, small hiatal hernia at the GE junction  Past Medical History:  Diagnosis Date   Allergy    Anemia    in past   Arthritis    OA   BPH (benign prostatic hyperplasia)    GERD (gastroesophageal reflux disease)    High cholesterol    Reflux      Past Surgical History:  Procedure Laterality Date   COLONOSCOPY     HEMORRHOID SURGERY     ROTATOR CUFF REPAIR Right    SHOULDER SURGERY Left    Family History  Problem Relation Age of Onset   Lung cancer Father    Other Mother    Colon cancer Neg Hx    Esophageal cancer Neg Hx    Stomach cancer Neg Hx    Pancreatic cancer Neg Hx    Liver disease Neg Hx     Social History   Tobacco Use   Smoking status: Former    Types: Cigarettes    Quit date: 1990    Years since quitting: 34.0   Smokeless tobacco: Never   Tobacco comments:    light smoker until early 90s  Vaping Use   Vaping Use: Never used  Substance Use Topics   Alcohol use: No   Drug use: No   Current Outpatient Medications  Medication Sig Dispense Refill   AMBULATORY NON FORMULARY MEDICATION Diltiazem gel with 5% lidocaine  Apply a pea sized amount into your rectum three times daily for several weeks and then as needed Dispense 30 GM zero refill 30 g 0   atorvastatin (LIPITOR) 20 MG tablet Take 20 mg by mouth daily.      clonazePAM (KLONOPIN) 1 MG tablet Take 2 tablets (2 mg total) by mouth 2 (two) times daily as needed for anxiety. 120 tablet 3   desonide (DESOWEN) 0.05 % cream Apply 1 application topically as needed.   11   doxazosin (CARDURA) 4 MG tablet Take 4 mg by mouth daily. 4 mg x 2     EQ FIBER SUPPLEMENT PO Take 2 tablets by mouth at bedtime.     fluticasone (FLONASE) 50 MCG/ACT nasal spray Place 1 spray into both nostrils daily.     Multiple Vitamin (MULTIVITAMIN) capsule Take 1 capsule by mouth daily.     naproxen (NAPROSYN) 500 MG tablet Take 500 mg by mouth as needed.     pantoprazole (PROTONIX) 40 MG tablet Take 20 mg by mouth daily.     polyethylene glycol powder (MIRALAX) 17 GM/SCOOP powder Take 0.5 Containers by mouth daily.     tiZANidine (ZANAFLEX) 4 MG tablet Take 1 tablet (4 mg total) by mouth 2 (two) times daily as needed for muscle spasms. 180 tablet 2   Calcium-Magnesium-Vitamin D (CALCIUM 1200+D3 PO) Take 1 tablet by mouth daily.     Chlorpheniramine-Phenylephrine (SUDAFED PE SINUS/ALLERGY PO) Take by mouth as needed.     oxymetazoline (AFRIN) 0.05 % nasal spray Place 1 spray into both nostrils at bedtime.     No current facility-administered medications for this visit.   Allergies  Allergen Reactions   Sulfa Antibiotics Rash    Fever , rash       Review of Systems: All systems reviewed and negative except where noted in HPI.   Lab Results  Component Value Date   WBC 5.3 03/02/2019   HGB 13.9 03/02/2019   HCT 41.3 03/02/2019   MCV 91.9 03/02/2019   PLT 145.0 (L) 03/02/2019    No results found for: "CREATININE", "BUN", "NA", "K", "CL", "CO2"  No results found  for: "ALT", "AST", "GGT", "ALKPHOS", "BILITOT"   Physical Exam: BP 134/72   Pulse 85   Ht '5\' 8"'$  (1.727 m)   Wt 158 lb 4 oz (71.8 kg)   BMI 24.06 kg/m  Constitutional: Pleasant,well-developed, male in no acute distress. Perianal exam - anterior midline anal fissure - CMA Tia Alert standby Extremities: no edema Neurological: Alert and oriented to person place and time. Skin: Skin is warm and dry. No rashes noted. Psychiatric: Normal mood and affect. Behavior is normal.   ASSESSMENT: 82 y.o. male here for assessment of the following  1. Anal fissure   2. History of colon polyps   3. Gastroesophageal reflux disease, unspecified whether esophagitis present   4. Long-term current use of proton pump inhibitor therapy   5. 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  He has an anterior midline anal fissure on perianal exam today.  His bowel movements have been regular with MiraLAX and fiber and he should continue that.  He has not used topical nitroglycerin or diltiazem in a long time.  He states this has helped him in the past.  Will give him some diltiazem lidocaine ointment, recommend pea-sized amount PR 3 times daily for several weeks until healed and then can use as needed.  If this fails to heal then he should contact me.  He has a history of 9 adenomatous polyps over 3 years ago.  We discussed at his age if he wanted to pursue surveillance colonoscopy or not.  He is in pretty good health for his age and has no significant comorbidities.  After discussion of this he does want to  pursue one final colonoscopy prior to stopping further surveillance, however the question is timing.  He is seeing a Psychologist, sport and exercise next week to evaluate his inguinal and periumbilical hernia and is likely planning on having this repaired.  He would prefer to have that done first and once he is recovered can call to schedule his colonoscopy.  I think that is reasonable.  He can call us at his convenience to schedule the colonoscopy.  Otherwise we reviewed his history of reflux.  He has no history of Barrett's esophagus on prior EGD.  We discussed management is mostly based on symptoms and he has absolutely no symptoms on moderate dose of Protonix.  I reviewed long-term risks of chronic PPI use with him, recommend lowest daily dose needed to control symptoms.  We will dose reduce his Protonix from 40 mg daily to 20 mg daily.  He will initially do this by cutting his pill in half and see if it works.  If he tolerates this well and has good control he will contact me and we will place a prescription for lower dose.  Over time he can continue to titrate down as he tolerates.   PLAN: - start diltiazem / lidocaine ointment - pea sized amount PR TID for a weeks and then PRN, ordered to [facility] - continue Miralax and fiber supplements - colonoscopy when he recovers from umbilical / inguinal hernia repair - he will call to schedule - dose reduce protonix from '40mg'$  to '20mg'$  / day - cut tablet in half and if works okay then we will refill at '20mg'$  / dose - discussed long term risks / benefits of chronic PPI  Jolly Mango, MD [facility]

## 2022-02-03 NOTE — Patient Instructions (Addendum)
If you are age 81 or older, your body mass index should be between 23-30. Your Body mass index is 24.06 kg/m. If this is out of the aforementioned range listed, please consider follow up with your Primary Care Provider.  If you are age 37 or younger, your body mass index should be between 19-25. Your Body mass index is 24.06 kg/m. If this is out of the aformentioned range listed, please consider follow up with your Primary Care Provider.   ________________________________________________________   We have sent a prescription for Diltiazem 5% gel with Lidocaine to [facility]. You should apply a pea size amount to your rectum three times daily for several weeks and then as needed.  [facility]'s information is below: Address: [address], [place], [place] [address]  [phone]  *Please DO NOT go directly from our office to pick up this medication! Give the pharmacy 1 day to process the prescription as this is compounded and takes time to make.  __________________________________________________________  Continue Miralax and a fiber supplement.  We will discuss scheduling a colonoscopy after your hernia repair.  Decrease your Protonix to 20 mg daily.  Let us know if you would like a new script.  Thank you for entrusting me with your care and for choosing [facility], Dr. Buhler Cellar

## 2022-02-04 ENCOUNTER — Telehealth: Payer: Self-pay | Admitting: Gastroenterology

## 2022-02-04 MED ORDER — PANTOPRAZOLE SODIUM 20 MG PO TBEC: 20.0000 mg | DELAYED_RELEASE_TABLET | Freq: Every day | ORAL | 1 refills | Status: AC

## 2022-02-04 NOTE — Telephone Encounter (Signed)
Inbound call from patient stating that he was seen yesterday and was advised to take  Protonix 40 mg tablet and cut the medication in half. Patient stated he will not be able to do so because the medication is too small. Patient is requesting a new proscription for 20 mg for 90 days and would like it to be sent to [facility] mail order. Please advise.

## 2022-02-04 NOTE — Telephone Encounter (Signed)
20 mg script sent to Mail Order pharmacy per request

## 2022-02-09 ENCOUNTER — Telehealth: Payer: Self-pay | Admitting: Gastroenterology

## 2022-02-09 NOTE — Telephone Encounter (Signed)
Patient is calling states that he has stopped using his ointment that was prescribed to him, says it has made the discomfort even worse. Please advise

## 2022-02-10 ENCOUNTER — Other Ambulatory Visit: Payer: Self-pay | Admitting: Surgery

## 2022-02-10 DIAGNOSIS — K429 Umbilical hernia without obstruction or gangrene: Secondary | ICD-10-CM | POA: Diagnosis not present

## 2022-02-10 DIAGNOSIS — K4090 Unilateral inguinal hernia, without obstruction or gangrene, not specified as recurrent: Secondary | ICD-10-CM | POA: Diagnosis not present

## 2022-02-10 NOTE — Telephone Encounter (Signed)
That is a bit surprising, he was given diltiazem with lidocaine, that would be very unusual to make his symptoms worse.  If he feels that the ointment is not helping we could switch him to 0.125% nitroglycerin ointment.  Pea-sized amount applied 3 times daily, would also need to come from [facility].  He can also apply some lidocaine ointment as needed over-the-counter.  Thanks

## 2022-02-11 NOTE — Telephone Encounter (Signed)
Called and spoke with patient regarding Dr. Doyne Keel recommendations. Pt states that he will continue with the prescribed ointment that he has, he will try using it twice a day at first and work his way up to TID. Patient states that he did not want to spend another $50 on ointment. I told pt that he can also try OTC lidocaine cream as well.  He has been advised that it can take several weeks for a fissure to heal. Pt will keep [place] updated. He verbalized understanding and had no concerns at the end of the call.

## 2022-02-13 DIAGNOSIS — R972 Elevated prostate specific antigen [PSA]: Secondary | ICD-10-CM | POA: Diagnosis not present

## 2022-02-19 DIAGNOSIS — N401 Enlarged prostate with lower urinary tract symptoms: Secondary | ICD-10-CM | POA: Diagnosis not present

## 2022-02-19 DIAGNOSIS — N529 Male erectile dysfunction, unspecified: Secondary | ICD-10-CM | POA: Diagnosis not present

## 2022-02-19 DIAGNOSIS — N138 Other obstructive and reflux uropathy: Secondary | ICD-10-CM | POA: Diagnosis not present

## 2022-02-19 DIAGNOSIS — R972 Elevated prostate specific antigen [PSA]: Secondary | ICD-10-CM | POA: Diagnosis not present

## 2022-03-02 DIAGNOSIS — G243 Spasmodic torticollis: Secondary | ICD-10-CM | POA: Diagnosis not present

## 2022-03-02 DIAGNOSIS — Z79899 Other long term (current) drug therapy: Secondary | ICD-10-CM | POA: Diagnosis not present

## 2022-03-05 ENCOUNTER — Encounter (HOSPITAL_BASED_OUTPATIENT_CLINIC_OR_DEPARTMENT_OTHER): Payer: Self-pay | Admitting: Surgery

## 2022-03-05 ENCOUNTER — Other Ambulatory Visit: Payer: Self-pay

## 2022-03-09 MED ORDER — ENSURE PRE-SURGERY PO LIQD: 296.0000 mL | Freq: Once | ORAL | Status: DC

## 2022-03-09 NOTE — Progress Notes (Signed)

## 2022-03-11 NOTE — H&P (Signed)
PROVIDER: Beverlee Nims, MD  MRN: [id number] DOB: Jan 17, 1941 DATE OF ENCOUNTER: 02/10/2022 Subjective  Chief Complaint: Long Term Follow Up (Umbilical Hernia )   History of Present Illness: Juan Kennedy is a 82 y.o. male who is seen today for for long-term follow-up regarding his umbilical hernia and right inguinal hernia. I saw him over a year and a half ago for these. He was having such symptoms from his arthritis he want to hold on hernia repair but was just making sure they were not urgent at that time. He is now doing much better from a orthopedic and arthritis standpoint and now the inguinal hernia is causing much more discomfort so he would like to have both hernias repaired. He reports that his been more difficult to reduce the inguinal hernia but he has had no obstructive symptoms with nausea or vomiting. He is staying fairly active..    Review of Systems: A complete review of systems was obtained from the patient. I have reviewed this information and discussed as appropriate with the patient. See HPI as well for other ROS.  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 Cervicalgia Coronary artery calcification seen on CAT scan Elevated prostate specific antigen (PSA) History of adenomatous polyp of colon History of gastroesophageal reflux (GERD) Hyperlipidemia Impotence of organic origin Myofascitis Rectal bleeding Seborrheic dermatitis, unspecified Cervical dystonia Umbilical hernia  Past Surgical History: Procedure Laterality Date HEMORRHOIDECTOMY INTERNAL & EXTERNAL 1995 Shoulder Surgery 2022   Allergies Allergen Reactions Sulfa (Sulfonamide Antibiotics) Rash Fever , rash Sulfacetamide Sodium Rash Fever ,  rash Sulfamethoxazole Rash Rash and fever  Current Outpatient Medications on File Prior to Visit Medication Sig Dispense Refill atorvastatin (LIPITOR) 20 MG tablet calcium carbonate-vitamin D3 (OS-CAL 500+D) 500 mg-5 mcg (200 unit) tablet Take 1 tablet by mouth 2 (two) times daily with meals clonazePAM (KLONOPIN) 1 MG tablet Take by mouth desonide (DESOWEN) 0.05 % cream Apply topically 2 (two) times daily doxazosin (CARDURA) 4 MG tablet Take 1 tablet by mouth 2 (two) times daily naproxen (NAPROSYN) 500 MG tablet oxymetazoline (AFRIN) 0.05 % nasal spray Place into one nostril pantoprazole (PROTONIX) 40 MG DR tablet Take 40 mg by mouth once daily tiZANidine (ZANAFLEX) 4 MG tablet Take by mouth  No current facility-administered medications on file prior to visit.  History reviewed. No pertinent family history.  Social History  Tobacco Use Smoking Status Former Types: Cigarettes Quit date: 1995 Years since quitting: 29.0 Smokeless Tobacco Never   Social History  Socioeconomic History Marital status: Married Tobacco Use Smoking status: Former Types: Cigarettes Quit date: 1995 Years since quitting: 29.0 Smokeless tobacco: Never Substance and Sexual Activity Alcohol use: Not Currently Drug use: Never  Objective:  Vitals: 02/10/22 1421 PainSc: 5  There is no height or weight on file to calculate BMI.  Physical Exam  He appears well on exam  He has a large umbilical hernia with a 3 cm fascial defect  He has a large right somewhat difficult to reduce inguinal hernia without evidence of left inguinal hernia  Labs, Imaging and Diagnostic Testing:  I reviewed the notes in the electronic medical records  Assessment and Plan:  Diagnoses and all orders for this visit:  Umbilical hernia without obstruction and without gangrene  Right inguinal hernia    He now wants to go ahead and proceed with repair of his  umbilical and inguinal hernia. As these are getting  larger and his risk of problems is getting higher, repair is recommended. I would recommend we do this is an open repair because he does have extramedical skin which may need to be excised. This would also allow [place] to limit the anesthesia to an LMA and a tap block given his age. I explained the surgical procedures in detail. We discussed the use of mesh with both the inguinal hernia and right inguinal hernia repair. I discussed the risk of surgery which includes but is not limited to bleeding, infection, injury to surrounding structures, hernia recurrence, the need for further procedures, nerve entrapment, chronic pain, cardiopulmonary issues, etc. He understands and wished to proceed with an open umbilical hernia and open right inguinal hernia repair with mesh which will be scheduled

## 2022-03-12 ENCOUNTER — Ambulatory Visit (HOSPITAL_BASED_OUTPATIENT_CLINIC_OR_DEPARTMENT_OTHER)
Admission: RE | Admit: 2022-03-12 | Discharge: 2022-03-12 | Disposition: A | Discharge status: Home / Self Care | Payer: Medicare HMO | Attending: Surgery | Admitting: Surgery

## 2022-03-12 ENCOUNTER — Other Ambulatory Visit: Payer: Self-pay

## 2022-03-12 ENCOUNTER — Ambulatory Visit (HOSPITAL_BASED_OUTPATIENT_CLINIC_OR_DEPARTMENT_OTHER): Payer: Medicare HMO | Admitting: Anesthesiology

## 2022-03-12 ENCOUNTER — Encounter (HOSPITAL_BASED_OUTPATIENT_CLINIC_OR_DEPARTMENT_OTHER): Payer: Self-pay | Admitting: Surgery

## 2022-03-12 ENCOUNTER — Encounter (HOSPITAL_BASED_OUTPATIENT_CLINIC_OR_DEPARTMENT_OTHER)
Admission: RE | Disposition: A | Discharge status: Home / Self Care | Payer: Self-pay | Source: Home / Self Care | Attending: Surgery

## 2022-03-12 DIAGNOSIS — K4090 Unilateral inguinal hernia, without obstruction or gangrene, not specified as recurrent: Secondary | ICD-10-CM | POA: Insufficient documentation

## 2022-03-12 DIAGNOSIS — K429 Umbilical hernia without obstruction or gangrene: Secondary | ICD-10-CM | POA: Diagnosis not present

## 2022-03-12 DIAGNOSIS — K219 Gastro-esophageal reflux disease without esophagitis: Secondary | ICD-10-CM | POA: Insufficient documentation

## 2022-03-12 DIAGNOSIS — Z01818 Encounter for other preprocedural examination: Secondary | ICD-10-CM

## 2022-03-12 DIAGNOSIS — G8918 Other acute postprocedural pain: Secondary | ICD-10-CM | POA: Diagnosis not present

## 2022-03-12 DIAGNOSIS — Z87891 Personal history of nicotine dependence: Secondary | ICD-10-CM | POA: Insufficient documentation

## 2022-03-12 HISTORY — PX: INGUINAL HERNIA REPAIR: SHX194

## 2022-03-12 HISTORY — PX: UMBILICAL HERNIA REPAIR: SHX196

## 2022-03-12 SURGERY — REPAIR, HERNIA, INGUINAL, ADULT
Anesthesia: Regional | Site: Groin | Laterality: Right

## 2022-03-12 MED ORDER — LIDOCAINE HCL (CARDIAC) PF 100 MG/5ML IV SOSY
PREFILLED_SYRINGE | INTRAVENOUS | Status: DC | PRN
  Administered 2022-03-12: 50 mg via INTRAVENOUS

## 2022-03-12 MED ORDER — FENTANYL CITRATE (PF) 100 MCG/2ML IJ SOLN
25.0000 ug | INTRAMUSCULAR | Status: DC | PRN
  Administered 2022-03-12: 50 ug via INTRAVENOUS

## 2022-03-12 MED ORDER — LACTATED RINGERS IV SOLN: INTRAVENOUS | Status: DC

## 2022-03-12 MED ORDER — PROPOFOL 10 MG/ML IV BOLUS
INTRAVENOUS | Status: AC
  Filled 2022-03-12: qty 20

## 2022-03-12 MED ORDER — OXYCODONE HCL 5 MG PO TABS
5.0000 mg | ORAL_TABLET | Freq: Once | ORAL | Status: AC
  Administered 2022-03-12: 5 mg via ORAL

## 2022-03-12 MED ORDER — FENTANYL CITRATE (PF) 100 MCG/2ML IJ SOLN
INTRAMUSCULAR | Status: DC | PRN
  Administered 2022-03-12: 50 ug via INTRAVENOUS

## 2022-03-12 MED ORDER — CEFAZOLIN SODIUM-DEXTROSE 2-4 GM/100ML-% IV SOLN
2.0000 g | INTRAVENOUS | Status: AC
  Administered 2022-03-12: 2 g via INTRAVENOUS

## 2022-03-12 MED ORDER — ONDANSETRON HCL 4 MG/2ML IJ SOLN
INTRAMUSCULAR | Status: AC
  Filled 2022-03-12: qty 2

## 2022-03-12 MED ORDER — EPHEDRINE 5 MG/ML INJ
INTRAVENOUS | Status: AC
  Filled 2022-03-12: qty 5

## 2022-03-12 MED ORDER — PHENYLEPHRINE 80 MCG/ML (10ML) SYRINGE FOR IV PUSH (FOR BLOOD PRESSURE SUPPORT)
PREFILLED_SYRINGE | INTRAVENOUS | Status: AC
  Filled 2022-03-12: qty 10

## 2022-03-12 MED ORDER — LIDOCAINE 2% (20 MG/ML) 5 ML SYRINGE
INTRAMUSCULAR | Status: AC
  Filled 2022-03-12: qty 5

## 2022-03-12 MED ORDER — FENTANYL CITRATE (PF) 100 MCG/2ML IJ SOLN
INTRAMUSCULAR | Status: AC
  Filled 2022-03-12: qty 2

## 2022-03-12 MED ORDER — OXYCODONE HCL 5 MG PO TABS
ORAL_TABLET | ORAL | Status: AC
  Filled 2022-03-12: qty 1

## 2022-03-12 MED ORDER — DEXAMETHASONE SODIUM PHOSPHATE 10 MG/ML IJ SOLN
INTRAMUSCULAR | Status: AC
  Filled 2022-03-12: qty 1

## 2022-03-12 MED ORDER — ACETAMINOPHEN 500 MG PO TABS
ORAL_TABLET | ORAL | Status: AC
  Filled 2022-03-12: qty 2

## 2022-03-12 MED ORDER — DEXAMETHASONE SODIUM PHOSPHATE 10 MG/ML IJ SOLN
INTRAMUSCULAR | Status: DC | PRN
  Administered 2022-03-12: 5 mg

## 2022-03-12 MED ORDER — ONDANSETRON HCL 4 MG/2ML IJ SOLN
INTRAMUSCULAR | Status: DC | PRN
  Administered 2022-03-12: 4 mg via INTRAVENOUS

## 2022-03-12 MED ORDER — EPHEDRINE SULFATE (PRESSORS) 50 MG/ML IJ SOLN
INTRAMUSCULAR | Status: DC | PRN
  Administered 2022-03-12: 10 mg via INTRAVENOUS

## 2022-03-12 MED ORDER — CHLORHEXIDINE GLUCONATE CLOTH 2 % EX PADS: 6.0000 | MEDICATED_PAD | Freq: Once | CUTANEOUS | Status: DC

## 2022-03-12 MED ORDER — TRAMADOL HCL 50 MG PO TABS: 50.0000 mg | ORAL_TABLET | Freq: Four times a day (QID) | ORAL | 0 refills | Status: DC | PRN

## 2022-03-12 MED ORDER — BUPIVACAINE HCL (PF) 0.25 % IJ SOLN
INTRAMUSCULAR | Status: DC | PRN
  Administered 2022-03-12: 20 mL

## 2022-03-12 MED ORDER — ACETAMINOPHEN 500 MG PO TABS
1000.0000 mg | ORAL_TABLET | ORAL | Status: AC
  Administered 2022-03-12: 1000 mg via ORAL

## 2022-03-12 MED ORDER — PROPOFOL 10 MG/ML IV BOLUS
INTRAVENOUS | Status: DC | PRN
  Administered 2022-03-12: 120 mg via INTRAVENOUS

## 2022-03-12 MED ORDER — DEXAMETHASONE SODIUM PHOSPHATE 4 MG/ML IJ SOLN
INTRAMUSCULAR | Status: DC | PRN
  Administered 2022-03-12: 5 mg via INTRAVENOUS

## 2022-03-12 MED ORDER — ROPIVACAINE HCL 5 MG/ML IJ SOLN
INTRAMUSCULAR | Status: DC | PRN
  Administered 2022-03-12: 30 mL via PERINEURAL

## 2022-03-12 MED ORDER — FENTANYL CITRATE (PF) 100 MCG/2ML IJ SOLN
100.0000 ug | Freq: Once | INTRAMUSCULAR | Status: AC
  Administered 2022-03-12: 100 ug via INTRAVENOUS

## 2022-03-12 MED ORDER — MIDAZOLAM HCL 2 MG/2ML IJ SOLN
INTRAMUSCULAR | Status: AC
  Filled 2022-03-12: qty 2

## 2022-03-12 MED ORDER — PHENYLEPHRINE HCL (PRESSORS) 10 MG/ML IV SOLN
INTRAVENOUS | Status: DC | PRN
  Administered 2022-03-12: 80 ug via INTRAVENOUS

## 2022-03-12 MED ORDER — CEFAZOLIN SODIUM-DEXTROSE 2-4 GM/100ML-% IV SOLN
INTRAVENOUS | Status: AC
  Filled 2022-03-12: qty 100

## 2022-03-12 SURGICAL SUPPLY — 50 items
ADH SKN CLS APL DERMABOND .7 (GAUZE/BANDAGES/DRESSINGS) ×2
APL PRP STRL LF DISP 70% ISPRP (MISCELLANEOUS) ×2
BLADE CLIPPER SURG (BLADE) ×2 IMPLANT
BLADE SURG 15 STRL LF DISP TIS (BLADE) ×2 IMPLANT
BLADE SURG 15 STRL SS (BLADE) ×2
CANISTER SUCT 1200ML W/VALVE (MISCELLANEOUS) IMPLANT
CHLORAPREP W/TINT 26 (MISCELLANEOUS) ×2 IMPLANT
COVER BACK TABLE 60X90IN (DRAPES) ×2 IMPLANT
COVER MAYO STAND STRL (DRAPES) ×2 IMPLANT
DERMABOND ADVANCED .7 DNX12 (GAUZE/BANDAGES/DRESSINGS) ×4 IMPLANT
DRAIN PENROSE .5X12 LATEX STL (DRAIN) ×2 IMPLANT
DRAPE LAPAROTOMY 100X72 PEDS (DRAPES) ×2 IMPLANT
DRAPE UTILITY XL STRL (DRAPES) ×2 IMPLANT
DRSG TEGADERM 2-3/8X2-3/4 SM (GAUZE/BANDAGES/DRESSINGS) IMPLANT
ELECT REM PT RETURN 9FT ADLT (ELECTROSURGICAL) ×2
ELECTRODE REM PT RTRN 9FT ADLT (ELECTROSURGICAL) ×2 IMPLANT
GLOVE SURG SIGNA 7.5 PF LTX (GLOVE) ×2 IMPLANT
GOWN STRL REUS W/ TWL LRG LVL3 (GOWN DISPOSABLE) ×2 IMPLANT
GOWN STRL REUS W/ TWL XL LVL3 (GOWN DISPOSABLE) ×2 IMPLANT
GOWN STRL REUS W/TWL LRG LVL3 (GOWN DISPOSABLE)
GOWN STRL REUS W/TWL XL LVL3 (GOWN DISPOSABLE) ×4
MESH PARIETEX PROGRIP RIGHT (Mesh General) IMPLANT
MESH VENTRALEX ST 1-7/10 CRC S (Mesh General) IMPLANT
NDL HYPO 25X1 1.5 SAFETY (NEEDLE) ×2 IMPLANT
NEEDLE HYPO 25X1 1.5 SAFETY (NEEDLE) ×2 IMPLANT
NS IRRIG 1000ML POUR BTL (IV SOLUTION) IMPLANT
PACK BASIN DAY SURGERY FS (CUSTOM PROCEDURE TRAY) ×2 IMPLANT
PENCIL SMOKE EVACUATOR (MISCELLANEOUS) ×2 IMPLANT
SLEEVE SCD COMPRESS KNEE MED (STOCKING) ×2 IMPLANT
SPIKE FLUID TRANSFER (MISCELLANEOUS) IMPLANT
SPONGE INTESTINAL PEANUT (DISPOSABLE) IMPLANT
SPONGE T-LAP 4X18 ~~LOC~~+RFID (SPONGE) ×2 IMPLANT
SUT MNCRL AB 4-0 PS2 18 (SUTURE) ×2 IMPLANT
SUT NOVA 0 T19/GS 22DT (SUTURE) IMPLANT
SUT NOVA NAB DX-16 0-1 5-0 T12 (SUTURE) IMPLANT
SUT NOVA NAB GS-21 1 T12 (SUTURE) IMPLANT
SUT SILK 2 0 SH (SUTURE) IMPLANT
SUT VIC AB 2-0 CT1 27 (SUTURE) ×4
SUT VIC AB 2-0 CT1 TAPERPNT 27 (SUTURE) ×4 IMPLANT
SUT VIC AB 2-0 SH 27 (SUTURE)
SUT VIC AB 2-0 SH 27XBRD (SUTURE) IMPLANT
SUT VIC AB 3-0 CT1 27 (SUTURE) ×2
SUT VIC AB 3-0 CT1 27XBRD (SUTURE) ×2 IMPLANT
SUT VIC AB 3-0 SH 27 (SUTURE) ×4
SUT VIC AB 3-0 SH 27X BRD (SUTURE) ×2 IMPLANT
SYR BULB EAR ULCER 3OZ GRN STR (SYRINGE) IMPLANT
SYR CONTROL 10ML LL (SYRINGE) ×2 IMPLANT
TOWEL GREEN STERILE FF (TOWEL DISPOSABLE) ×2 IMPLANT
TUBE CONNECTING 20X1/4 (TUBING) IMPLANT
YANKAUER SUCT BULB TIP NO VENT (SUCTIONS) IMPLANT

## 2022-03-12 NOTE — Discharge Instructions (Addendum)
[facility] _______[facility]  UMBILICAL OR INGUINAL HERNIA REPAIR: POST OP INSTRUCTIONS  Always review your discharge instruction sheet given to you by the facility where your surgery was performed. IF YOU HAVE DISABILITY OR FAMILY LEAVE FORMS, YOU MUST BRING THEM TO THE OFFICE FOR PROCESSING.   DO NOT GIVE THEM TO YOUR DOCTOR.  1. A  prescription for pain medication may be given to you upon discharge.  Take your pain medication as prescribed, if needed.  If narcotic pain medicine is not needed, then you may take acetaminophen (Tylenol) or ibuprofen (Advil) as needed. 2. Take your usually prescribed medications unless otherwise directed. If you need a refill on your pain medication, please contact your pharmacy.  They will contact our office to request authorization. Prescriptions will not be filled after 5 pm or on week-ends. 3. You should follow a light diet the first 24 hours after arrival home, such as soup and crackers, etc.  Be sure to include lots of fluids daily.  Resume your normal diet the day after surgery. 4.Most patients will experience some swelling and bruising around the umbilicus or in the groin and scrotum.  Ice packs and reclining will help.  Swelling and bruising can take several days to resolve.  6. It is common to experience some constipation if taking pain medication after surgery.  Increasing fluid intake and taking a stool softener (such as Colace) will usually help or prevent this problem from occurring.  A mild laxative (Milk of Magnesia or Miralax) should be taken according to package directions if there are no bowel movements after 48 hours. 7. Unless discharge instructions indicate otherwise, you may remove your bandages 24-48 hours after surgery, and you may shower at that time.  You may have steri-strips (small skin tapes) in place directly over the incision.  These strips should be left on the skin for 7-10 days.  If your surgeon used skin glue on the  incision, you may shower in 24 hours.  The glue will flake off over the next 2-3 weeks.  Any sutures or staples will be removed at the office during your follow-up visit. 8. ACTIVITIES:  You may resume regular (light) daily activities beginning the next day--such as daily self-care, walking, climbing stairs--gradually increasing activities as tolerated.  You may have sexual intercourse when it is comfortable.  Refrain from any heavy lifting or straining until approved by your doctor.  a.You may drive when you are no longer taking prescription pain medication, you can comfortably wear a seatbelt, and you can safely maneuver your car and apply brakes. b.RETURN TO WORK:   _____________________________________________  9.You should see your doctor in the office for a follow-up appointment approximately 2-3 weeks after your surgery.  Make sure that you call for this appointment within a day or two after you arrive home to insure a convenient appointment time. 10.OTHER INSTRUCTIONS: _OK TO SHOWER STARTING TOMORROW ICE PACK, TYLENOL, AND IBUPROFEN ALSO FOR PAIN NO LIFTING MORE THAN 15 POUNDS FOR 4 WEEKS ________________________    _____________________________________  WHEN TO CALL YOUR DOCTOR: Fever over 101.0 Inability to urinate Nausea and/or vomiting Extreme swelling or bruising Continued bleeding from incision. Increased pain, redness, or drainage from the incision  The clinic staff is available to answer your questions during regular business hours.  Please don't hesitate to call and ask to speak to one of the nurses for clinical concerns.  If you have a medical emergency, go to the nearest emergency room or call 911.  A surgeon from [facility] is always on call at the hospital   [address] [place], [place], [place]  [address] ?  [address], [place], [place]   [address] [phone] ? [phone] ? FAX [phone] Web site:  [email or web address]  *You had '5mg'$  of Oxycodone at 1115 today.

## 2022-03-12 NOTE — Interval H&P Note (Signed)
History and Physical Interval Note:no change in H and P  03/12/2022 8:29 AM  Juan Kennedy  has presented today for surgery, with the diagnosis of RIGHT INGUINAL HERNIA, UMBILICAL HERNIA.  The various methods of treatment have been discussed with the patient and family. After consideration of risks, benefits and other options for treatment, the patient has consented to  Procedure(s) with comments: OPEN RIGHT INGUINAL HERNIA REPAIR (Right) - LMA TAP BLOCK UMBILICAL HERNIA REPAIR WITH MESH (N/A) as a surgical intervention.  The patient's history has been reviewed, patient examined, no change in status, stable for surgery.  I have reviewed the patient's chart and labs.  Questions were answered to the patient's satisfaction.     Coralie Keens

## 2022-03-12 NOTE — Progress Notes (Signed)
Assisted Dr. Lanetta Inch with right, transabdominal plane, ultrasound guided block. Side rails up, monitors on throughout procedure. See vital signs in flow sheet. Tolerated Procedure well.

## 2022-03-12 NOTE — Op Note (Signed)
OPEN RIGHT INGUINAL HERNIA REPAIR, UMBILICAL HERNIA REPAIR WITH MESH  Procedure Note  Juan Kennedy 03/12/2022   Pre-op Diagnosis: RIGHT INGUINAL HERNIA, UMBILICAL HERNIA with 2 cm fascial defect     Post-op Diagnosis: same  Procedure(s): OPEN RIGHT INGUINAL HERNIA REPAIR WITH MESH UMBILICAL HERNIA REPAIR WITH MESH (2 CM FASCIAL DEFECT)  Surgeon(s): Coralie Keens, MD  Anesthesia: General  Staff:  Circulator: Maurene Capes, RN Scrub Person: Logan Bores  Estimated Blood Loss: Minimal               Findings: The patient was found to have a indirect and direct right inguinal hernia which was repaired with a large piece of Prolene ProGrip mesh from [employer].  He had an umbilical hernia with a 2 cm fascial defect which was repaired with a 4.3 cm round ventral Prolene patch from [employer]  Procedure: The patient was brought to the operating identified as a correct patient.  He was placed upon the operating table and general anesthesia was induced.  His abdomen was then prepped and draped in usual sterile fashion.  I anesthetized the skin in the right inguinal area and umbilicus with Marcaine.  I made a longitudinal incision in the right inguinal area with a scalpel.  I dissected down through Scarpa's fascia with the cautery.  I then identified the external oblique fascia and opened it toward the internal and external rings.  I then controlled the testicular cord structures with a Penrose drain.  The patient had a large lipoma of the testicular cord as well as an indirect hernia.  He also had a direct hernia defect.  I excised the lipoma with the cautery.  All contents from the hernia sac had been reduced back into the abdominal cavity.  I tied off the base of the sac and imbricated the inguinal floor with 2-0 silk sutures.  Next a large piece of Prolene ProGrip mesh was brought to the field.  I placed it against the pubic tubercle and then brought around the cord structures.  I then  sutured in place with a single 2-0 Vicryl suture to the pubic tubercle.  Wide coverage inguinal floor and coverage of the internal ring appeared to be achieved well.  I then closed the external oblique fascia over the top of the mesh with a running 2-0 Vicryl suture.  Scarpa's fascia was then closed with interrupted 3-0 Vicryl sutures and the skin was closed with running 4-0 Monocryl. I next made a transverse incision below the umbilicus with a scalpel.  I carried this down to the hernia sac.  The hernia sac contained a large amount of omentum.  I excised the hernia sac from around the fascia and the overlying umbilical skin.  I was then able to completely reduce the omentum back into the abdominal cavity.  The fascial defect was 2 cm in size.  I brought a 4.3 cm round ventral Prolene patch onto the field and placed it through the fascial opening and then pulled it up against the peritoneum.  The mesh was then sewn in place with interrupted 0 Novafil sutures.  I cut the stay ties and then closed the fascia over the top of the mesh with a figure-of-eight and interrupted #1 Novafil suture.  I anesthetized the fascia further with Marcaine.  Hemostasis appeared to be achieved.  I tacked the umbilical skin back in place with interrupted 3-0 Vicryl sutures.  I then closed the subcutaneous tissue with interrupted 3-0 Vicryl sutures and  closed the skin with a running 4-0 Monocryl.  Dermabond was then placed to both incisions.  The patient tolerated the procedure well.  All the counts were correct at the end of the procedure.  The patient was then extubated in the operating room and taken in a stable condition to the recovery room.          Coralie Keens   Date: 03/12/2022  Time: 10:20 AM

## 2022-03-12 NOTE — Anesthesia Procedure Notes (Signed)
Anesthesia Regional Block: Quadratus lumborum   Pre-Anesthetic Checklist: , timeout performed,  Correct Patient, Correct Site, Correct Laterality,  Correct Procedure, Correct Position, site marked,  Risks and benefits discussed,  Pre-op evaluation,  At surgeon's request and post-op pain management  Laterality: Right  Prep: Maximum Sterile Barrier Precautions used, chloraprep       Needles:  Injection technique: Single-shot  Needle Type: Echogenic Stimulator Needle     Needle Length: 9cm  Needle Gauge: 21     Additional Needles:   Procedures:,,,, ultrasound used (permanent image in chart),,    Narrative:  Start time: 03/12/2022 8:54 AM End time: 03/12/2022 8:57 AM Injection made incrementally with aspirations every 5 mL. Anesthesiologist: Freddrick March, MD

## 2022-03-12 NOTE — Transfer of Care (Signed)
Immediate Anesthesia Transfer of Care Note  Patient: Juan Kennedy  Procedure(s) Performed: OPEN RIGHT INGUINAL HERNIA REPAIR (Right: Groin) UMBILICAL HERNIA REPAIR WITH MESH (Abdomen)  Patient Location: PACU  Anesthesia Type:General  Level of Consciousness: awake, alert , and oriented  Airway & Oxygen Therapy: Patient Spontanous Breathing and Patient connected to face mask oxygen  Post-op Assessment: Report given to RN and Post -op Vital signs reviewed and stable  Post vital signs: Reviewed and stable  Last Vitals:  Vitals Value Taken Time  BP    Temp    Pulse    Resp    SpO2      Last Pain:  Vitals:   03/12/22 0824  TempSrc: Oral  PainSc: 1       Patients Stated Pain Goal: 6 (45/91/36 8599)  Complications: No notable events documented.

## 2022-03-12 NOTE — Anesthesia Preprocedure Evaluation (Addendum)
Anesthesia Evaluation  Patient identified by MRN, date of birth, ID band Patient awake    Reviewed: Allergy & Precautions, NPO status , Patient's Chart, lab work & pertinent test results  Airway Mallampati: III  TM Distance: >3 FB Neck ROM: Limited    Dental no notable dental hx. (+) Teeth Intact, Caps, Loose   Pulmonary former smoker   Pulmonary exam normal breath sounds clear to auscultation       Cardiovascular negative cardio ROS Normal cardiovascular exam Rhythm:Regular Rate:Normal     Neuro/Psych negative neurological ROS  negative psych ROS   GI/Hepatic Neg liver ROS,GERD  Medicated,,  Endo/Other  negative endocrine ROS    Renal/GU negative Renal ROS  negative genitourinary   Musculoskeletal  (+) Arthritis ,    Abdominal   Peds  Hematology negative hematology ROS (+)   Anesthesia Other Findings   Reproductive/Obstetrics                              Anesthesia Physical Anesthesia Plan  ASA: 2  Anesthesia Plan: General and Regional   Post-op Pain Management: Regional block* and Tylenol PO (pre-op)*   Induction: Intravenous  PONV Risk Score and Plan: 2 and Ondansetron, Dexamethasone and Midazolam  Airway Management Planned: LMA  Additional Equipment:   Intra-op Plan:   Post-operative Plan: Extubation in OR  Informed Consent: I have reviewed the patients History and Physical, chart, labs and discussed the procedure including the risks, benefits and alternatives for the proposed anesthesia with the patient or authorized representative who has indicated his/her understanding and acceptance.     Dental advisory given  Plan Discussed with: CRNA and Anesthesiologist  Anesthesia Plan Comments:         Anesthesia Quick Evaluation

## 2022-03-12 NOTE — Anesthesia Procedure Notes (Signed)
Procedure Name: LMA Insertion Date/Time: 03/12/2022 9:24 AM  Performed by: Bufford Spikes, CRNAPre-anesthesia Checklist: Patient identified, Emergency Drugs available, Suction available and Patient being monitored Patient Re-evaluated:Patient Re-evaluated prior to induction Oxygen Delivery Method: Circle system utilized Preoxygenation: Pre-oxygenation with 100% oxygen Induction Type: IV induction Ventilation: Mask ventilation without difficulty LMA: LMA inserted LMA Size: 4.0 Number of attempts: 1 Placement Confirmation: positive ETCO2 Tube secured with: Tape Dental Injury: Teeth and Oropharynx as per pre-operative assessment

## 2022-03-12 NOTE — Anesthesia Postprocedure Evaluation (Signed)
Anesthesia Post Note  Patient: Juan Kennedy  Procedure(s) Performed: OPEN RIGHT INGUINAL HERNIA REPAIR (Right: Groin) UMBILICAL HERNIA REPAIR WITH MESH (Abdomen)     Patient location during evaluation: PACU Anesthesia Type: General Level of consciousness: awake and alert and oriented Pain management: pain level controlled Vital Signs Assessment: post-procedure vital signs reviewed and stable Respiratory status: spontaneous breathing, nonlabored ventilation and respiratory function stable Cardiovascular status: blood pressure returned to baseline and stable Postop Assessment: no apparent nausea or vomiting Anesthetic complications: no   No notable events documented.  Last Vitals:  Vitals:   03/12/22 1040 03/12/22 1055  BP: 125/70 (!) 140/74  Kennedy: 60 64  Resp: 18 (!) 9  Temp:    SpO2: 98% 95%    Last Pain:  Vitals:   03/12/22 1055  TempSrc:   PainSc: 5                  Pecolia Marando A.

## 2022-03-13 ENCOUNTER — Encounter (HOSPITAL_BASED_OUTPATIENT_CLINIC_OR_DEPARTMENT_OTHER): Payer: Self-pay | Admitting: Surgery

## 2022-05-18 DIAGNOSIS — H35372 Puckering of macula, left eye: Secondary | ICD-10-CM | POA: Diagnosis not present

## 2022-05-18 DIAGNOSIS — H43812 Vitreous degeneration, left eye: Secondary | ICD-10-CM | POA: Diagnosis not present

## 2022-05-18 DIAGNOSIS — H04123 Dry eye syndrome of bilateral lacrimal glands: Secondary | ICD-10-CM | POA: Diagnosis not present

## 2022-05-18 DIAGNOSIS — H26492 Other secondary cataract, left eye: Secondary | ICD-10-CM | POA: Diagnosis not present

## 2022-06-03 DIAGNOSIS — Z0000 Encounter for general adult medical examination without abnormal findings: Secondary | ICD-10-CM | POA: Diagnosis not present

## 2022-06-03 DIAGNOSIS — E785 Hyperlipidemia, unspecified: Secondary | ICD-10-CM | POA: Diagnosis not present

## 2022-06-03 DIAGNOSIS — K5900 Constipation, unspecified: Secondary | ICD-10-CM | POA: Diagnosis not present

## 2022-06-03 DIAGNOSIS — R7309 Other abnormal glucose: Secondary | ICD-10-CM | POA: Diagnosis not present

## 2022-06-08 DIAGNOSIS — Z0000 Encounter for general adult medical examination without abnormal findings: Secondary | ICD-10-CM | POA: Diagnosis not present

## 2022-06-08 DIAGNOSIS — N400 Enlarged prostate without lower urinary tract symptoms: Secondary | ICD-10-CM | POA: Diagnosis not present

## 2022-06-08 DIAGNOSIS — K219 Gastro-esophageal reflux disease without esophagitis: Secondary | ICD-10-CM | POA: Diagnosis not present

## 2022-06-08 DIAGNOSIS — Z9181 History of falling: Secondary | ICD-10-CM | POA: Diagnosis not present

## 2022-06-08 DIAGNOSIS — M436 Torticollis: Secondary | ICD-10-CM | POA: Diagnosis not present

## 2022-06-08 DIAGNOSIS — R972 Elevated prostate specific antigen [PSA]: Secondary | ICD-10-CM | POA: Diagnosis not present

## 2022-06-08 DIAGNOSIS — R7309 Other abnormal glucose: Secondary | ICD-10-CM | POA: Diagnosis not present

## 2022-06-08 DIAGNOSIS — E785 Hyperlipidemia, unspecified: Secondary | ICD-10-CM | POA: Diagnosis not present

## 2022-06-08 DIAGNOSIS — D696 Thrombocytopenia, unspecified: Secondary | ICD-10-CM | POA: Diagnosis not present

## 2022-08-24 DIAGNOSIS — R972 Elevated prostate specific antigen [PSA]: Secondary | ICD-10-CM | POA: Diagnosis not present

## 2022-08-27 DIAGNOSIS — R972 Elevated prostate specific antigen [PSA]: Secondary | ICD-10-CM | POA: Diagnosis not present

## 2022-08-27 DIAGNOSIS — N138 Other obstructive and reflux uropathy: Secondary | ICD-10-CM | POA: Diagnosis not present

## 2022-08-27 DIAGNOSIS — N401 Enlarged prostate with lower urinary tract symptoms: Secondary | ICD-10-CM | POA: Diagnosis not present

## 2022-08-27 DIAGNOSIS — N529 Male erectile dysfunction, unspecified: Secondary | ICD-10-CM | POA: Diagnosis not present

## 2022-10-08 ENCOUNTER — Telehealth: Payer: Self-pay | Admitting: Gastroenterology

## 2022-10-08 ENCOUNTER — Other Ambulatory Visit: Payer: Self-pay

## 2022-10-08 MED ORDER — AMBULATORY NON FORMULARY MEDICATION: 0 refills | Status: DC

## 2022-10-08 NOTE — Telephone Encounter (Signed)
Inbound call from patients states his prescription called into the pharmacy has been denied. Please advise.

## 2022-10-09 NOTE — Telephone Encounter (Signed)
I spoke with Juan Kennedy and he picked up the Diltiazem/lidocaine gel last night. I told him no one's  insurance covers unfortunately.

## 2022-10-13 ENCOUNTER — Telehealth: Payer: Self-pay | Admitting: Gastroenterology

## 2022-10-13 NOTE — Telephone Encounter (Signed)
Okay, thanks for the note.  Suspect he may have a prolapsed or thrombosed hemorrhoid, however need to make sure he does not have rectal prolapse as well.  He needs to be examined in the office.  Agree he should continue fiber supplementation and MiraLAX if constipated.  He can continue with the diltiazem and lidocaine to help relieve spasm.  He could also try some over-the-counter Calmol 4 suppositories.  I think he Sitz baths are also good idea.  If the APP's have any openings in the next few days that would be best.  I do not have any other clinic time until Thursday p.m.  I do not have any open spots during that clinic session but can over book him to be seen at 4:30 PM if nothing else open.  He can also see his PCP in the interim or urgent care if he needs this urgently evaluated.  Thanks

## 2022-10-13 NOTE — Telephone Encounter (Signed)
Called and spoke with patient. Pt reports that he has rectal pain/discomfort, he has a hx of anal fissure. I informed pt that fissure would not hang from his rectum. I informed pt that it is possible that he has developed an external hemorrhoid. Pt reports that he has noticed some changes in his bowel habits as well. Intermittent diarrhea, large episode midnight on Saturday. Pt noticed some red blood. Pt was taking 1 capful of Miralax daily and 2 fiber gummies. Pt reports that he has held Miralax since diarrhea episode on Saturday. No BM Sunday, he has had to strain some to have a BM since then. Pt will restart Miralax tonight. Pt states that he feels like the rectal protrusion happened overnight (originally noted 4-6 days ago). Pt has tried generic OTC rectal suppositories daily with no relief. Pt did refill Diltiazem with lidocaine but he said it is hard to tell if it is working or not. Pt has done sitz baths in the past, but has not tried them recently. I tried to look for an appt so that patient can be evaluated in the office but we do not have any openings at this time. Please advise, thanks.

## 2022-10-13 NOTE — Telephone Encounter (Signed)
Called and spoke with patient regarding recommendations as outlined below. Doug Sou, PA-C had a cancellation for Thursday, 10/15/22 at 11 am. Pt states that he thinks he can make it to his appt without needing to go to an urgent care. Patient verbalized understanding and had no concerns at the end of the call.

## 2022-10-13 NOTE — Telephone Encounter (Signed)
Inbound call from patient stating he feels "tissue hanging down out of his rectum". Patient stated he has never felt this before. Patient requesting a call back to discuss further. Please advise, thank you.

## 2022-10-15 ENCOUNTER — Ambulatory Visit: Payer: Medicare HMO | Admitting: Gastroenterology

## 2022-10-15 ENCOUNTER — Encounter: Payer: Self-pay | Admitting: Gastroenterology

## 2022-10-15 VITALS — BP 100/60 | HR 80 | Ht 65.5 in | Wt 153.1 lb

## 2022-10-15 DIAGNOSIS — K644 Residual hemorrhoidal skin tags: Secondary | ICD-10-CM

## 2022-10-15 MED ORDER — DOXYCYCLINE HYCLATE 100 MG PO CAPS: 100.0000 mg | ORAL_CAPSULE | Freq: Every day | ORAL | 0 refills | Status: DC

## 2022-10-15 NOTE — Progress Notes (Signed)
Agree with assessment and plan as outlined.  

## 2022-10-15 NOTE — Patient Instructions (Addendum)
We have sent the following medications to your pharmacy for you to pick up at your convenience: Doxycyline 100 mg daily for 5 days.   Apply Destin/Zinc Oxide to perianal area.   Use Recticare as needed for pain.   Please call Monday with an update.  _______________________________________________________  If your blood pressure at your visit was 140/90 or greater, please contact your primary care physician to follow up on this.  _______________________________________________________  If you are age 27 or older, your body mass index should be between 23-30. Your Body mass index is 25.09 kg/m. If this is out of the aforementioned range listed, please consider follow up with your Primary Care Provider.  If you are age 32 or younger, your body mass index should be between 19-25. Your Body mass index is 25.09 kg/m. If this is out of the aformentioned range listed, please consider follow up with your Primary Care Provider.   ________________________________________________________  The [facility] providers would like to encourage you to use [facility] to communicate with providers for non-urgent requests or questions.  Due to long hold times on the telephone, sending your provider a message by [facility] may be a faster and more efficient way to get a response.  Please allow 48 business hours for a response.  Please remember that this is for non-urgent requests.  _______________________________________________________

## 2022-10-15 NOTE — Progress Notes (Signed)
10/15/2022 Juan Kennedy [id number] 19-Feb-1940   HISTORY OF PRESENT ILLNESS: This is an 82 year old male who is a patient of Dr. Lanetta Inch.  He has a history of anal fissure and uses diltiazem intermittently over the last handful of years.  He called here earlier this week, however, with complaints of a new type of pain or discomfort.  He describes development of some type of tissue hanging from his bottom.  He called and spoke with the nurse and was given this appointment.  They recommended sitz bath and keeping stools soft.  The area of concern is very tender/painful uncomfortable to sit.  He has had bleeding as well.  Colonoscopy 09/22/18.  - Skin tags were found on perianal exam. - A 4 mm TA polyp was found in the cecum. The polyp was flat. The polyp was removed with a   cold snare. Resection and retrieval were complete. - Five sessile and flat TA polyps were found in the ascending colon. The polyps were 3 to 4 mm   in size. These polyps were removed with a cold snare. Resection and retrieval were   complete. - Two sessileTA  polyps were found in the transverse colon. The polyps were 4 mm in size.   These polyps were removed with a cold snare. Resection and retrieval were complete. - A 3 mm TA polyp was found in the sigmoid colon. The polyp was sessile. The polyp was   removed with a cold snare. Resection and retrieval were complete. - Multiple medium-mouthed diverticula were found in the sigmoid colon. - Internal hemorrhoids were found during retroflexion. The hemorrhoids were small. - The exam was otherwise without abnormality. -Recall colonoscopy in 3 years if appropriate at the age of 25    Surgical [P], colon, cecal, ascending, transverse, sigmoid, polyp (9) - TUBULAR ADENOMA(S) - NEGATIVE FOR HIGH-GRADE DYSPLASIA OR MALIGNANCY   Colonoscopy 03/17/13 -  5 mm polyp in the descending colon. This is found to be adenomatous and patient was told to return in 5 years for  follow-up. Moderate diverticulosis in the descending colon     EGD 03/17/13: by Dr. Lanell Matar at [facility]. EGD showed normal stomach, normal duodenum, normal Z line, small hiatal hernia at the GE junction    Past Medical History:  Diagnosis Date   Allergy    Anemia    in past   Arthritis    OA   BPH (benign prostatic hyperplasia)    GERD (gastroesophageal reflux disease)    High cholesterol    Reflux    Past Surgical History:  Procedure Laterality Date   COLONOSCOPY     HEMORRHOID SURGERY     INGUINAL HERNIA REPAIR Right 03/12/2022   Procedure: OPEN RIGHT INGUINAL HERNIA REPAIR;  Surgeon: Abigail Miyamoto, MD;  Location: [facility];  Service: General;  Laterality: Right;  LMA TAP BLOCK   ROTATOR CUFF REPAIR Right    SHOULDER SURGERY Left    UMBILICAL HERNIA REPAIR N/A 03/12/2022   Procedure: UMBILICAL HERNIA REPAIR WITH MESH;  Surgeon: Abigail Miyamoto, MD;  Location: [facility];  Service: General;  Laterality: N/A;    reports that he quit smoking about 34 years ago. His smoking use included cigarettes. He has never used smokeless tobacco. He reports that he does not drink alcohol and does not use drugs. family history includes Lung cancer in his father; Other in his mother. Allergies  Allergen Reactions   Sulfa Antibiotics Rash  Fever , rash       Outpatient Encounter Medications as of 10/15/2022  Medication Sig   AMBULATORY NON FORMULARY MEDICATION Diltiazem gel with 5% lidocaine  Apply a pea sized amount into your rectum three times daily for several weeks and then as needed Dispense 30 GM zero refill   atorvastatin (LIPITOR) 20 MG tablet Take 20 mg by mouth daily.    clonazePAM (KLONOPIN) 1 MG tablet Take 2 tablets (2 mg total) by mouth 2 (two) times daily as needed for anxiety.   desonide (DESOWEN) 0.05 % cream Apply 1 application topically as needed.    doxazosin (CARDURA) 4 MG tablet Take 4 mg by mouth daily. 4 mg x 2   EQ  FIBER SUPPLEMENT PO Take 2 tablets by mouth at bedtime.   fluticasone (FLONASE) 50 MCG/ACT nasal spray Place 1 spray into both nostrils daily.   Multiple Vitamin (MULTIVITAMIN) capsule Take 1 capsule by mouth daily.   naproxen (NAPROSYN) 500 MG tablet Take 500 mg by mouth as needed.   pantoprazole (PROTONIX) 20 MG tablet Take 1 tablet (20 mg total) by mouth daily.   polyethylene glycol powder (MIRALAX) 17 GM/SCOOP powder Take 0.5 Containers by mouth daily.   tiZANidine (ZANAFLEX) 4 MG tablet Take 1 tablet (4 mg total) by mouth 2 (two) times daily as needed for muscle spasms.   Azelastine HCl 137 MCG/SPRAY SOLN Place 1 Dose into the nose as needed. (Patient not taking: Reported on 10/15/2022)   [DISCONTINUED] Calcium-Magnesium-Vitamin D (CALCIUM 1200+D3 PO) Take 1 tablet by mouth daily.   [DISCONTINUED] Chlorpheniramine-Phenylephrine (SUDAFED PE SINUS/ALLERGY PO) Take by mouth as needed.   [DISCONTINUED] oxymetazoline (AFRIN) 0.05 % nasal spray Place 1 spray into both nostrils at bedtime.   [DISCONTINUED] traMADol (ULTRAM) 50 MG tablet Take 1 tablet (50 mg total) by mouth every 6 (six) hours as needed for moderate pain or severe pain.   No facility-administered encounter medications on file as of 10/15/2022.     REVIEW OF SYSTEMS  : All other systems reviewed and negative except where noted in the History of Present Illness.   PHYSICAL EXAM: BP 100/60 (BP Location: Left Arm, Patient Position: Sitting, Cuff Size: Normal)   Pulse 80   Ht 5' 5.5" (1.664 m) Comment: measured without shoes  Wt 153 lb 2 oz (69.5 kg)   BMI 25.09 kg/m  General: Well developed white male in no acute distress Head: Normocephalic and atraumatic Eyes:  Sclerae anicteric, conjunctiva pink. Ears: Normal auditory acuity Lungs: Clear throughout to auscultation; no W/R/R. Heart: Regular rate and rhythm Abdomen: Soft, non-distended.  BS present.  Non-tender. Rectal: Patient had an external hemorrhoid versus a very  enlarged skin tag externally.  Was not thrombosed, but appeared ulcerated with maybe some pus appearance, very tender (not thrombosed).  Had a couple of areas that look like pustules near this, but those were nontender.  DRE did not reveal any masses or tenderness. Musculoskeletal: Symmetrical with no gross deformities  Skin: No lesions on visible extremities Extremities: No edema  Neurological: Alert oriented x 4, grossly non-focal Psychological:  Alert and cooperative. Normal mood and affect  ASSESSMENT AND PLAN: *Ulcerated external hemorrhoid versus large skin tag (not thrombosed): The appearance of this is new within the past week.  I had Dr. Tomasa Rand look at this as well.  We are going to treat him with a few days of doxycycline.  Will prescribe doxycycline 100 mg daily for 5 days.  Continue with sitz bath's and stool softeners, avoiding  overaggressive perianal hygiene.  Should use Desitin/zinc oxide as a barrier to the area.  Samples given of RectiCare as well.  Can use Tylenol for discomfort.  He will contact us back early next week with an update.  He is due/overdue for colonoscopy and plans to schedule that with Dr. Adela Lank in the near future.   CC:  Farris Has, MD

## 2022-10-27 DIAGNOSIS — Z23 Encounter for immunization: Secondary | ICD-10-CM | POA: Diagnosis not present

## 2022-12-09 DIAGNOSIS — L309 Dermatitis, unspecified: Secondary | ICD-10-CM | POA: Diagnosis not present

## 2022-12-09 DIAGNOSIS — M549 Dorsalgia, unspecified: Secondary | ICD-10-CM | POA: Diagnosis not present

## 2022-12-09 DIAGNOSIS — R972 Elevated prostate specific antigen [PSA]: Secondary | ICD-10-CM | POA: Diagnosis not present

## 2022-12-09 DIAGNOSIS — M436 Torticollis: Secondary | ICD-10-CM | POA: Diagnosis not present

## 2022-12-09 DIAGNOSIS — K219 Gastro-esophageal reflux disease without esophagitis: Secondary | ICD-10-CM | POA: Diagnosis not present

## 2022-12-09 DIAGNOSIS — E785 Hyperlipidemia, unspecified: Secondary | ICD-10-CM | POA: Diagnosis not present

## 2022-12-09 DIAGNOSIS — K5900 Constipation, unspecified: Secondary | ICD-10-CM | POA: Diagnosis not present

## 2023-03-03 DIAGNOSIS — G243 Spasmodic torticollis: Secondary | ICD-10-CM | POA: Diagnosis not present

## 2023-03-22 DIAGNOSIS — N138 Other obstructive and reflux uropathy: Secondary | ICD-10-CM | POA: Diagnosis not present

## 2023-03-22 DIAGNOSIS — R972 Elevated prostate specific antigen [PSA]: Secondary | ICD-10-CM | POA: Diagnosis not present

## 2023-03-22 DIAGNOSIS — N401 Enlarged prostate with lower urinary tract symptoms: Secondary | ICD-10-CM | POA: Diagnosis not present

## 2023-05-06 DIAGNOSIS — R972 Elevated prostate specific antigen [PSA]: Secondary | ICD-10-CM | POA: Diagnosis not present

## 2023-05-06 DIAGNOSIS — N138 Other obstructive and reflux uropathy: Secondary | ICD-10-CM | POA: Diagnosis not present

## 2023-05-06 DIAGNOSIS — N401 Enlarged prostate with lower urinary tract symptoms: Secondary | ICD-10-CM | POA: Diagnosis not present

## 2023-05-06 DIAGNOSIS — N529 Male erectile dysfunction, unspecified: Secondary | ICD-10-CM | POA: Diagnosis not present

## 2023-05-27 DIAGNOSIS — H52223 Regular astigmatism, bilateral: Secondary | ICD-10-CM | POA: Diagnosis not present

## 2023-05-27 DIAGNOSIS — H5005 Alternating esotropia: Secondary | ICD-10-CM | POA: Diagnosis not present

## 2023-05-27 DIAGNOSIS — H26492 Other secondary cataract, left eye: Secondary | ICD-10-CM | POA: Diagnosis not present

## 2023-05-27 DIAGNOSIS — H524 Presbyopia: Secondary | ICD-10-CM | POA: Diagnosis not present

## 2023-05-27 DIAGNOSIS — H43812 Vitreous degeneration, left eye: Secondary | ICD-10-CM | POA: Diagnosis not present

## 2023-05-27 DIAGNOSIS — H35372 Puckering of macula, left eye: Secondary | ICD-10-CM | POA: Diagnosis not present

## 2023-06-11 DIAGNOSIS — E785 Hyperlipidemia, unspecified: Secondary | ICD-10-CM | POA: Diagnosis not present

## 2023-06-11 DIAGNOSIS — Z0000 Encounter for general adult medical examination without abnormal findings: Secondary | ICD-10-CM | POA: Diagnosis not present

## 2023-06-11 DIAGNOSIS — Z79899 Other long term (current) drug therapy: Secondary | ICD-10-CM | POA: Diagnosis not present

## 2023-06-11 DIAGNOSIS — Z5181 Encounter for therapeutic drug level monitoring: Secondary | ICD-10-CM | POA: Diagnosis not present

## 2023-06-11 DIAGNOSIS — R7309 Other abnormal glucose: Secondary | ICD-10-CM | POA: Diagnosis not present

## 2023-06-11 DIAGNOSIS — D696 Thrombocytopenia, unspecified: Secondary | ICD-10-CM | POA: Diagnosis not present

## 2023-06-11 DIAGNOSIS — K219 Gastro-esophageal reflux disease without esophagitis: Secondary | ICD-10-CM | POA: Diagnosis not present

## 2023-06-15 DIAGNOSIS — Z0000 Encounter for general adult medical examination without abnormal findings: Secondary | ICD-10-CM | POA: Diagnosis not present

## 2023-06-15 DIAGNOSIS — K219 Gastro-esophageal reflux disease without esophagitis: Secondary | ICD-10-CM | POA: Diagnosis not present

## 2023-06-15 DIAGNOSIS — N400 Enlarged prostate without lower urinary tract symptoms: Secondary | ICD-10-CM | POA: Diagnosis not present

## 2023-06-15 DIAGNOSIS — R972 Elevated prostate specific antigen [PSA]: Secondary | ICD-10-CM | POA: Diagnosis not present

## 2023-06-15 DIAGNOSIS — R7309 Other abnormal glucose: Secondary | ICD-10-CM | POA: Diagnosis not present

## 2023-06-15 DIAGNOSIS — E785 Hyperlipidemia, unspecified: Secondary | ICD-10-CM | POA: Diagnosis not present

## 2023-06-15 DIAGNOSIS — M549 Dorsalgia, unspecified: Secondary | ICD-10-CM | POA: Diagnosis not present

## 2023-06-15 DIAGNOSIS — M436 Torticollis: Secondary | ICD-10-CM | POA: Diagnosis not present

## 2023-06-30 DIAGNOSIS — M4316 Spondylolisthesis, lumbar region: Secondary | ICD-10-CM | POA: Diagnosis not present

## 2023-07-15 DIAGNOSIS — M5126 Other intervertebral disc displacement, lumbar region: Secondary | ICD-10-CM | POA: Diagnosis not present

## 2023-07-15 DIAGNOSIS — M47816 Spondylosis without myelopathy or radiculopathy, lumbar region: Secondary | ICD-10-CM | POA: Diagnosis not present

## 2023-07-15 DIAGNOSIS — M4316 Spondylolisthesis, lumbar region: Secondary | ICD-10-CM | POA: Diagnosis not present

## 2023-07-15 DIAGNOSIS — M5136 Other intervertebral disc degeneration, lumbar region with discogenic back pain only: Secondary | ICD-10-CM | POA: Diagnosis not present

## 2023-08-03 DIAGNOSIS — M4316 Spondylolisthesis, lumbar region: Secondary | ICD-10-CM | POA: Diagnosis not present

## 2023-08-03 DIAGNOSIS — M48062 Spinal stenosis, lumbar region with neurogenic claudication: Secondary | ICD-10-CM | POA: Diagnosis not present

## 2023-09-06 DIAGNOSIS — M5116 Intervertebral disc disorders with radiculopathy, lumbar region: Secondary | ICD-10-CM | POA: Diagnosis not present

## 2023-09-06 DIAGNOSIS — M48062 Spinal stenosis, lumbar region with neurogenic claudication: Secondary | ICD-10-CM | POA: Diagnosis not present

## 2023-10-07 ENCOUNTER — Encounter: Payer: Self-pay | Admitting: Physical Medicine & Rehabilitation

## 2023-10-26 DIAGNOSIS — M791 Myalgia, unspecified site: Secondary | ICD-10-CM | POA: Diagnosis not present

## 2023-10-26 DIAGNOSIS — M48062 Spinal stenosis, lumbar region with neurogenic claudication: Secondary | ICD-10-CM | POA: Diagnosis not present

## 2023-11-09 DIAGNOSIS — Z23 Encounter for immunization: Secondary | ICD-10-CM | POA: Diagnosis not present

## 2023-11-16 ENCOUNTER — Encounter: Payer: Self-pay | Admitting: Physical Medicine & Rehabilitation

## 2023-11-16 ENCOUNTER — Encounter: Attending: Physical Medicine & Rehabilitation | Admitting: Physical Medicine & Rehabilitation

## 2023-11-16 VITALS — BP 128/82 | HR 79 | Ht 65.5 in | Wt 156.0 lb

## 2023-11-16 DIAGNOSIS — M542 Cervicalgia: Secondary | ICD-10-CM | POA: Insufficient documentation

## 2023-11-16 DIAGNOSIS — M47812 Spondylosis without myelopathy or radiculopathy, cervical region: Secondary | ICD-10-CM | POA: Diagnosis not present

## 2023-11-16 DIAGNOSIS — M609 Myositis, unspecified: Secondary | ICD-10-CM | POA: Diagnosis not present

## 2023-11-16 DIAGNOSIS — M961 Postlaminectomy syndrome, not elsewhere classified: Secondary | ICD-10-CM | POA: Insufficient documentation

## 2023-11-16 NOTE — Progress Notes (Signed)
 Subjective:    Patient ID: Juan Kennedy, male    DOB: 14-Oct-1940, 83 y.o.   MRN: [id number]  HPI Discussed the use of AI scribe software for clinical note transcription with the patient, who gave verbal consent to proceed.  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  He experiences chronic neck pain, with symptoms more pronounced on the right side. Pain and difficulty controlling neck movements increase during activities such as watching TV, attending church, and reading. He notes that 'the less control, the more pain' he experiences. Activities like singing or reading aloud seem to alleviate some discomfort.  He has a history of receiving Botox injections for cervical dystonia, which did not provide relief. Physical therapy was also attempted without significant improvement. Current medications include clonazepam  and tizanidine , both taken once daily in the evening, with tizanidine  causing significant dry mouth. Naproxen is used occasionally for pain relief, though its effectiveness is uncertain.  He mentions a past history of low back pain, significantly alleviated following a laminectomy. However, he still experiences intermittent discomfort in a specific spot on the right side of his lower back, which was temporarily relieved by an injection. This discomfort is described as 'coming and going'.  He reports a shoulder issue that occasionally causes pain when reaching in certain ways, though this is not a primary concern at this visit.    Pain Inventory Average Pain 6 Pain Right Now 7 My pain is intermittent and aching  In the last 24 hours, has pain interfered with the following? General activity 4 Relation with others 2 Enjoyment of life 6 What TIME of day is your pain at its worst? evening Sleep (in general) Good  Pain is worse with: some  activites Pain improves with: heat/ice and medication Relief from Meds: 4  walk without assistance how many minutes can you walk? 20-30 ability to climb steps?  yes do you drive?  yes  retired I need assistance with the following:  household duties  bladder control problems  Any changes since last visit?  no  Any changes since last visit?  no    Family History  Problem Relation Age of Onset   Lung cancer Father    Other Mother    Colon cancer Neg Hx    Esophageal cancer Neg Hx    Stomach cancer Neg Hx    Pancreatic cancer Neg Hx    Liver disease Neg Hx    Social History   Socioeconomic History   Marital status: Married    Spouse name: Not on file   Number of children: 3   Years of education: Not on file   Highest education level: Not on file  Occupational History   Occupation: retired  Tobacco Use   Smoking status: Former    Current packs/day: 0.00    Types: Cigarettes    Quit date: 1990    Years since quitting: 35.8   Smokeless tobacco: Never   Tobacco comments:    light smoker until early 90s  Vaping Use   Vaping status: Never Used  Substance and Sexual Activity   Alcohol use: No   Drug use: No   Sexual activity: Not on file  Other Topics Concern   Not on file  Social History Narrative   Not on file   Social Drivers of Health   Financial Resource Strain: Not on file  Food Insecurity: Not on file  Transportation Needs: Not on file  Physical Activity: Not on file  Stress: Not on file  Social Connections: Not on file   Past Surgical History:  Procedure Laterality Date   COLONOSCOPY     HEMORRHOID SURGERY     INGUINAL HERNIA REPAIR Right 03/12/2022   Procedure: OPEN RIGHT INGUINAL HERNIA REPAIR;  Surgeon: Vernetta Berg, MD;  Location: [facility];  Service: General;  Laterality: Right;  LMA TAP BLOCK   ROTATOR CUFF REPAIR Right    SHOULDER SURGERY Left    UMBILICAL HERNIA REPAIR N/A 03/12/2022   Procedure: UMBILICAL HERNIA  REPAIR WITH MESH;  Surgeon: Vernetta Berg, MD;  Location: [facility];  Service: General;  Laterality: N/A;   Past Medical History:  Diagnosis Date   Allergy    Anemia    in past   Arthritis    OA   BPH (benign prostatic hyperplasia)    GERD (gastroesophageal reflux disease)    High cholesterol    Reflux    BP 128/82   Pulse 79   Ht 5' 5.5 (1.664 m)   Wt 156 lb (70.8 kg)   SpO2 96%   BMI 25.56 kg/m   Opioid Risk Score:   Fall Risk Score:  `1  Depression screen [facility] 2/9     11/16/2023    1:52 PM  Depression screen PHQ 2/9  Decreased Interest 0  Down, Depressed, Hopeless 0  PHQ - 2 Score 0  Altered sleeping 0  Tired, decreased energy 1  Change in appetite 0  Feeling bad or failure about yourself  0  Trouble concentrating 0  Moving slowly or fidgety/restless 0  Suicidal thoughts 0  PHQ-9 Score 1    Review of Systems  Genitourinary:        Rentention  & Urgency  All other systems reviewed and are negative.      Objective:   Physical Exam Vitals and nursing note reviewed.  Constitutional:      Appearance: He is normal weight.  HENT:     Head: Normocephalic and atraumatic.  Eyes:     Extraocular Movements: Extraocular movements intact.     Conjunctiva/sclera: Conjunctivae normal.     Pupils: Pupils are equal, round, and reactive to light.  Neurological:     General: No focal deficit present.     Mental Status: He is alert and oriented to person, place, and time.     Comments: Evidence of right lateral Collis which is intermittent Motor strength is 5/5 bilateral deltoid, bicep, tricep, grip Tone is normal in the upper and lower limbs Ambulates without assistive device no evidence of toe drag or knee instability Speech without dysarthria or aphasia. No facial droop  Psychiatric:        Mood and Affect: Mood normal.        Behavior: Behavior normal.   Cervical spine has full cervical spine flexion 50% extension 50% lateral bending  and rotation. There is tenderness right greater than the left lateral neck area in the posterior cervical triangle. 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 Current medications provide limited relief with side effects. - Initiate electroacupuncture trial with four visits to assess efficacy. - Schedule acupuncture sessions weekly  or bi-weekly. - Confirm insurance preauthorization for acupuncture treatment for neck pain. - Document neck and back pain diagnoses for insurance purposes.  Chronic low back pain, status post laminectomy Chronic low back pain with residual discomfort near right iliac crest, may respond to acupuncture post-laminectomy. - Include low back pain diagnosis in insurance documentation for acupuncture treatment.

## 2023-11-16 NOTE — Patient Instructions (Addendum)
 Acupuncture Acupuncture is a type of treatment that involves stimulating specific points on your body by inserting thin needles through your skin. Acupuncture is often used to treat pain, but it may also be used to help relieve other types of symptoms. Your health care provider may recommend acupuncture to help treat various conditions, such as: Migraine and tension headaches. Nausea and vomiting after a surgery or cancer treatment. Sudden or severe (acute) pain, or long-term (chronic) pain. Addiction. Acupuncture is based on traditional [place] medicine, which recognizes more than 2,000 points on the body that connect energy pathways (meridians) through the body. The goal in stimulating these points is to balance the physical, emotional, and mental energy in your body. Acupuncture is done by a health care provider who has specialized training (licensed acupuncture practitioner). Treatment often requires several acupuncture sessions. You may have acupuncture along with other medical treatments. Tell a health care provider about: Any allergies you have. All medicines you are taking, including vitamins, herbs, eye drops, creams, and over-the-counter medicines. Any blood disorders you have. Any surgeries you have had. Any medical conditions you have. Whether you are pregnant or may be pregnant. What are the risks? Generally, this is a safe treatment. However, problems may occur, including: Skin infection. Damage to organs or structures that are under the skin if a needle is placed too deeply. This is rare. What happens before the treatment? Your acupuncture practitioner will ask about your medical history and your symptoms. You may have a physical exam. What happens during the treatment? The exact procedure will depend on your condition and how your acupuncture provider treats it. In general: Your skin will be cleaned with a germ-killing (antiseptic) solution. Your acupuncture practitioner will  open a new set of germ-free (sterile) needles. The needles will be gently inserted into your skin. They will be left in place for a certain amount of time. You may feel a tingling or burning sensation for a very short period of time. Your acupuncture practitioner may: Apply electrical energy to the needles. Adjust the needles in certain ways. After your procedure, the acupuncture practitioner will remove the needles, throw them away, and clean your skin. The procedure may vary among health care providers. What can I expect after the treatment? People react differently to acupuncture. Make sure you ask your acupuncture provider what to expect after your treatment. It is common to have: Minor bruising. Mild pain. A small amount of bleeding. Follow these instructions at home: Follow any instructions given by your provider after the treatment. Keep all follow-up visits. This is important. Where to find more information [facility]: [email or web address] Contact a health care provider if: You have questions about your reaction to the treatment. You have soreness. You have skin irritation or redness. You have a fever. Summary Acupuncture is a type of treatment that involves stimulating specific points on your body by inserting thin needles through your skin. This treatment is often used to treat pain, but it may also be used to help relieve other types of symptoms. The exact procedure will depend on your condition and how your acupuncture provider treats it. This information is not intended to replace advice given to you by your health care provider. Make sure you discuss any questions you have with your health care provider. Document Revised: 09/25/2020 Document Reviewed: 09/25/2020 Elsevier Patient Education  2024 Elsevier Inc.   VISIT SUMMARY: You visited us  today to discuss your chronic neck pain and difficulty controlling neck  movements due to  cervical dystonia, as well as intermittent low back pain following a past laminectomy.  YOUR PLAN: CERVICAL DYSTONIA WITH CHRONIC NECK PAIN: You have chronic neck pain and difficulty controlling neck movements, especially on the right side. Previous treatments like Botox and physical therapy have not been effective. -We will start a trial of electroacupuncture with four visits to see if it helps. -Acupuncture sessions will be scheduled weekly or bi-weekly. -We will confirm insurance preauthorization for acupuncture treatment for your neck pain. -We will document your neck and back pain diagnoses for insurance purposes.  CHRONIC LOW BACK PAIN, STATUS POST LAMINECTOMY: You have intermittent low back pain near the right iliac crest, which has been managed by injections after your laminectomy. -We will include your low back pain diagnosis in the insurance documentation for acupuncture treatment.                      Contains text generated by [employer].                                 Contains text generated by [employer].

## 2023-12-14 DIAGNOSIS — R1031 Right lower quadrant pain: Secondary | ICD-10-CM | POA: Diagnosis not present

## 2023-12-16 ENCOUNTER — Other Ambulatory Visit: Payer: Self-pay | Admitting: Surgery

## 2023-12-16 DIAGNOSIS — R1031 Right lower quadrant pain: Secondary | ICD-10-CM

## 2023-12-17 DIAGNOSIS — N400 Enlarged prostate without lower urinary tract symptoms: Secondary | ICD-10-CM | POA: Diagnosis not present

## 2023-12-17 DIAGNOSIS — K219 Gastro-esophageal reflux disease without esophagitis: Secondary | ICD-10-CM | POA: Diagnosis not present

## 2023-12-17 DIAGNOSIS — M436 Torticollis: Secondary | ICD-10-CM | POA: Diagnosis not present

## 2023-12-17 DIAGNOSIS — R7309 Other abnormal glucose: Secondary | ICD-10-CM | POA: Diagnosis not present

## 2023-12-17 DIAGNOSIS — M549 Dorsalgia, unspecified: Secondary | ICD-10-CM | POA: Diagnosis not present

## 2023-12-17 DIAGNOSIS — E785 Hyperlipidemia, unspecified: Secondary | ICD-10-CM | POA: Diagnosis not present

## 2023-12-21 ENCOUNTER — Encounter: Admitting: Physical Medicine & Rehabilitation

## 2023-12-21 DIAGNOSIS — R972 Elevated prostate specific antigen [PSA]: Secondary | ICD-10-CM | POA: Diagnosis not present

## 2023-12-21 DIAGNOSIS — N401 Enlarged prostate with lower urinary tract symptoms: Secondary | ICD-10-CM | POA: Diagnosis not present

## 2023-12-21 DIAGNOSIS — N138 Other obstructive and reflux uropathy: Secondary | ICD-10-CM | POA: Diagnosis not present

## 2023-12-24 ENCOUNTER — Ambulatory Visit
Admission: RE | Admit: 2023-12-24 | Discharge: 2023-12-24 | Disposition: A | Discharge status: Home / Self Care | Source: Ambulatory Visit | Attending: Surgery | Admitting: Surgery

## 2023-12-24 DIAGNOSIS — N400 Enlarged prostate without lower urinary tract symptoms: Secondary | ICD-10-CM | POA: Diagnosis not present

## 2023-12-24 DIAGNOSIS — R1031 Right lower quadrant pain: Secondary | ICD-10-CM

## 2023-12-24 DIAGNOSIS — K5730 Diverticulosis of large intestine without perforation or abscess without bleeding: Secondary | ICD-10-CM | POA: Diagnosis not present

## 2023-12-24 MED ORDER — IOPAMIDOL (ISOVUE-370) INJECTION 76%
80.0000 mL | Freq: Once | INTRAVENOUS | Status: AC | PRN
  Administered 2023-12-24: 80 mL via INTRAVENOUS

## 2023-12-28 ENCOUNTER — Encounter: Admitting: Physical Medicine & Rehabilitation

## 2023-12-28 DIAGNOSIS — M48062 Spinal stenosis, lumbar region with neurogenic claudication: Secondary | ICD-10-CM | POA: Diagnosis not present

## 2024-01-04 ENCOUNTER — Encounter: Payer: Self-pay | Admitting: Physical Medicine & Rehabilitation

## 2024-01-04 ENCOUNTER — Encounter: Attending: Physical Medicine & Rehabilitation | Admitting: Physical Medicine & Rehabilitation

## 2024-01-04 VITALS — BP 144/87 | HR 89 | Ht 65.5 in | Wt 156.4 lb

## 2024-01-04 DIAGNOSIS — M436 Torticollis: Secondary | ICD-10-CM | POA: Insufficient documentation

## 2024-01-04 DIAGNOSIS — M542 Cervicalgia: Secondary | ICD-10-CM | POA: Diagnosis present

## 2024-01-04 DIAGNOSIS — M961 Postlaminectomy syndrome, not elsewhere classified: Secondary | ICD-10-CM | POA: Insufficient documentation

## 2024-01-04 NOTE — Progress Notes (Signed)
 Acupuncture for chronic neck pain related to cervical dystonia. Bega et al used as reference for acupuncture treatment   Examination shows activation of the splenius capitis irregular intermittent activation.  No tenderness over the trapezius no tenderness over the sternocleidomastoid no excessive tone at these areas  Needles placed right SI 3 right SI 15 right SI 16 and right SI 17 electrode stimulation between SI 15 and SI 17 Additional needle was placed at GB 20 on the right side Patient tolerated procedure well Treatment time 25 minutes

## 2024-01-11 ENCOUNTER — Encounter: Admitting: Physical Medicine & Rehabilitation

## 2024-01-11 ENCOUNTER — Encounter: Payer: Self-pay | Admitting: Physical Medicine & Rehabilitation

## 2024-01-11 VITALS — BP 115/69 | HR 63 | Ht 65.5 in | Wt 159.4 lb

## 2024-01-11 DIAGNOSIS — M961 Postlaminectomy syndrome, not elsewhere classified: Secondary | ICD-10-CM | POA: Diagnosis not present

## 2024-01-11 DIAGNOSIS — M542 Cervicalgia: Secondary | ICD-10-CM | POA: Diagnosis not present

## 2024-01-11 NOTE — Progress Notes (Unsigned)
 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  #2 Discussed purpose of acupuncture including requiring multiple visits to assess efficacy. The patient would like to focus on his low back on the right side for chronic pain that he has experienced.  He has had lumbar laminectomy and chronic postoperative pain. Needles placed at GV4 Right BL 52 BL 52.5 cross needles with connection to right GB 30 2 Hz stimulation x 25 minutes Patient tolerated procedure well Will see the patient back in 1 week for the third visit

## 2024-01-13 ENCOUNTER — Encounter: Payer: Self-pay | Admitting: Physical Medicine & Rehabilitation

## 2024-01-18 ENCOUNTER — Encounter: Payer: Self-pay | Admitting: Physical Medicine & Rehabilitation

## 2024-01-18 ENCOUNTER — Encounter: Admitting: Physical Medicine & Rehabilitation

## 2024-01-18 VITALS — BP 126/81 | HR 57 | Ht 65.5 in | Wt 159.0 lb

## 2024-01-18 DIAGNOSIS — M961 Postlaminectomy syndrome, not elsewhere classified: Secondary | ICD-10-CM

## 2024-01-18 DIAGNOSIS — M542 Cervicalgia: Secondary | ICD-10-CM | POA: Diagnosis not present

## 2024-01-18 NOTE — Progress Notes (Signed)
 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  #3 Discussed purpose of acupuncture including requiring multiple visits to assess efficacy. The patient would like to focus on his low back on the right side for chronic pain that he has experienced.  He has had lumbar laminectomy and chronic postoperative pain. Needle placed at GV4 Right BL 22,23,24,25, BL 50,51,52,53  2 Hz stimulation x 25 minutes Patient tolerated procedure well Will see the patient back in 1 week for the fourth visit

## 2024-01-18 NOTE — Patient Instructions (Signed)
 Acupuncture Acupuncture is a type of treatment that involves stimulating specific points on your body by inserting thin needles through your skin. Acupuncture is often used to treat pain, but it may also be used to help relieve other types of symptoms. Your health care provider may recommend acupuncture to help treat various conditions, such as: Migraine and tension headaches. Nausea and vomiting after a surgery or cancer treatment. Sudden or severe (acute) pain, or long-term (chronic) pain. Addiction. Acupuncture is based on traditional [place] medicine, which recognizes more than 2,000 points on the body that connect energy pathways (meridians) through the body. The goal in stimulating these points is to balance the physical, emotional, and mental energy in your body. Acupuncture is done by a health care provider who has specialized training (licensed acupuncture practitioner). Treatment often requires several acupuncture sessions. You may have acupuncture along with other medical treatments. Tell a health care provider about: Any allergies you have. All medicines you are taking, including vitamins, herbs, eye drops, creams, and over-the-counter medicines. Any blood disorders you have. Any surgeries you have had. Any medical conditions you have. Whether you are pregnant or may be pregnant. What are the risks? Generally, this is a safe treatment. However, problems may occur, including: Skin infection. Damage to organs or structures that are under the skin if a needle is placed too deeply. This is rare. What happens before the treatment? Your acupuncture practitioner will ask about your medical history and your symptoms. You may have a physical exam. What happens during the treatment? The exact procedure will depend on your condition and how your acupuncture provider treats it. In general: Your skin will be cleaned with a germ-killing (antiseptic) solution. Your acupuncture practitioner will  open a new set of germ-free (sterile) needles. The needles will be gently inserted into your skin. They will be left in place for a certain amount of time. You may feel a tingling or burning sensation for a very short period of time. Your acupuncture practitioner may: Apply electrical energy to the needles. Adjust the needles in certain ways. After your procedure, the acupuncture practitioner will remove the needles, throw them away, and clean your skin. The procedure may vary among health care providers. What can I expect after the treatment? People react differently to acupuncture. Make sure you ask your acupuncture provider what to expect after your treatment. It is common to have: Minor bruising. Mild pain. A small amount of bleeding. Follow these instructions at home: Follow any instructions given by your provider after the treatment. Keep all follow-up visits. This is important. Where to find more information [facility]: [email or web address] Contact a health care provider if: You have questions about your reaction to the treatment. You have soreness. You have skin irritation or redness. You have a fever. Summary Acupuncture is a type of treatment that involves stimulating specific points on your body by inserting thin needles through your skin. This treatment is often used to treat pain, but it may also be used to help relieve other types of symptoms. The exact procedure will depend on your condition and how your acupuncture provider treats it. This information is not intended to replace advice given to you by your health care provider. Make sure you discuss any questions you have with your health care provider. Document Revised: 09/25/2020 Document Reviewed: 09/25/2020 [employer] Patient Education  2024 [employer].

## 2024-01-25 ENCOUNTER — Encounter: Payer: Self-pay | Admitting: Physical Medicine & Rehabilitation

## 2024-01-25 ENCOUNTER — Encounter: Admitting: Physical Medicine & Rehabilitation

## 2024-01-25 VITALS — BP 132/81 | HR 63 | Ht 65.5 in | Wt 158.0 lb

## 2024-01-25 DIAGNOSIS — M961 Postlaminectomy syndrome, not elsewhere classified: Secondary | ICD-10-CM

## 2024-01-25 NOTE — Patient Instructions (Addendum)
 Recommend monthly visits x 12 Please discussed  neck pain with Dr. Joshua

## 2024-01-25 NOTE — Progress Notes (Signed)
 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  #4 Discussed purpose of acupuncture including requiring multiple visits to assess efficacy. The patient would like to focus on his low back on the right side for chronic pain that he has experienced.  He has had lumbar laminectomy and chronic postoperative pain. Needle placed at GV4 Right BL 22,23,24,25, BL 50,51,52,53  2 Hz stimulation x 25 minutes Patient tolerated procedure well Patient is noticing improvement with low back pain.  Will schedule for monthly treatments pending insurance authorization

## 2024-03-09 ENCOUNTER — Encounter: Admitting: Physical Medicine & Rehabilitation
